# Patient Record
Sex: Female | Born: 1940 | Race: White | Hispanic: No | Marital: Married | Smoking: Former smoker
Health system: Southern US, Community
[De-identification: ages and names within clinical notes are randomized; demographics above are authoritative.]

## PROBLEM LIST (undated history)

## (undated) DIAGNOSIS — E785 Hyperlipidemia, unspecified: Secondary | ICD-10-CM

## (undated) DIAGNOSIS — I10 Essential (primary) hypertension: Secondary | ICD-10-CM

## (undated) DIAGNOSIS — K219 Gastro-esophageal reflux disease without esophagitis: Secondary | ICD-10-CM

## (undated) DIAGNOSIS — I2699 Other pulmonary embolism without acute cor pulmonale: Secondary | ICD-10-CM

## (undated) HISTORY — PX: CHOLECYSTECTOMY: SHX55

## (undated) HISTORY — PX: ABDOMINAL HYSTERECTOMY: SHX81

## (undated) HISTORY — PX: EYE SURGERY: SHX253

---

## 2005-07-14 ENCOUNTER — Ambulatory Visit: Payer: Self-pay | Admitting: Internal Medicine

## 2006-07-15 ENCOUNTER — Ambulatory Visit: Payer: Self-pay | Admitting: Internal Medicine

## 2006-09-01 ENCOUNTER — Ambulatory Visit: Payer: Self-pay | Admitting: Internal Medicine

## 2006-09-02 ENCOUNTER — Inpatient Hospital Stay: Payer: Self-pay | Admitting: Surgery

## 2006-09-08 ENCOUNTER — Inpatient Hospital Stay: Payer: Self-pay | Admitting: Surgery

## 2006-09-08 ENCOUNTER — Other Ambulatory Visit: Payer: Self-pay

## 2007-04-05 ENCOUNTER — Inpatient Hospital Stay: Payer: Self-pay | Admitting: Internal Medicine

## 2007-04-05 ENCOUNTER — Ambulatory Visit: Payer: Self-pay | Admitting: Internal Medicine

## 2007-07-19 ENCOUNTER — Ambulatory Visit: Payer: Self-pay | Admitting: Internal Medicine

## 2007-09-10 ENCOUNTER — Ambulatory Visit: Payer: Self-pay | Admitting: Ophthalmology

## 2007-09-16 ENCOUNTER — Ambulatory Visit: Payer: Self-pay | Admitting: Ophthalmology

## 2008-06-26 ENCOUNTER — Ambulatory Visit: Payer: Self-pay | Admitting: Unknown Physician Specialty

## 2008-07-19 ENCOUNTER — Ambulatory Visit: Payer: Self-pay | Admitting: Internal Medicine

## 2009-07-20 ENCOUNTER — Ambulatory Visit: Payer: Self-pay | Admitting: Internal Medicine

## 2009-11-19 ENCOUNTER — Ambulatory Visit: Payer: Self-pay | Admitting: Internal Medicine

## 2009-11-19 IMAGING — CT CT CHEST W/ CM
2 series · 15 of 31 positions shown, 19 images · non-contrast
Comparison: none

REASON FOR EXAM: SOB   Hx PE  Call Report [[email or web address]_NUMBER]
COMMENTS:

PROCEDURE:     CT  - CT CHEST (FOR PE) W  - [DATE]  [DATE]
RESULT:     Chest CT dated [DATE] comparison made to prior study dated
[DATE].
TECHNIQUE: Helical 3 mm sections were obtained from the thoracic inlet
through the lung bases status post intravenous administration of 100 mL
[QE].

[Series 4: soft tissue · axial · 0.63mm/px · z∈[+262,+304]mm · 2 of 95 slices shown]
[im 8/95  mediastinal]
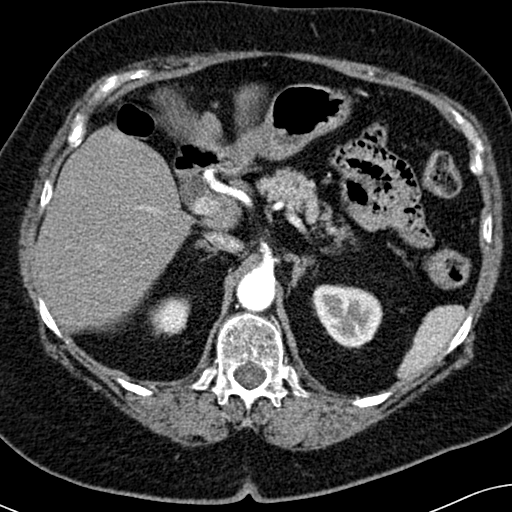
[im 22/95  mediastinal]
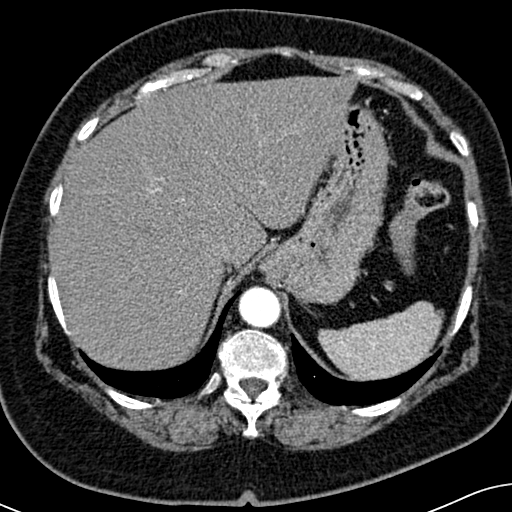

[Series 5: lung windows · axial · 0.63mm/px · z∈[+268,+498]mm · 13 of 93 slices shown, 17 images]
[im 8/93  mediastinal]
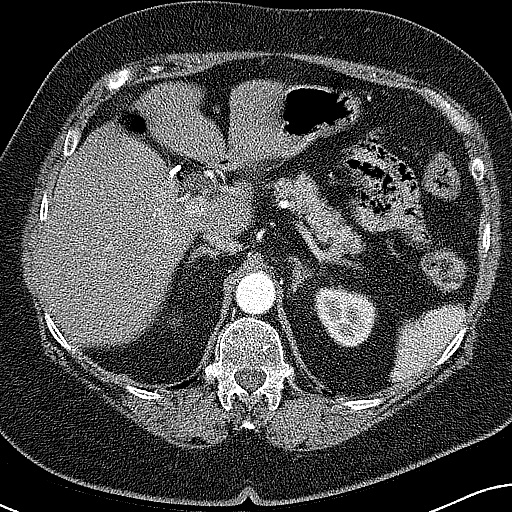
[im 8/93  lung]
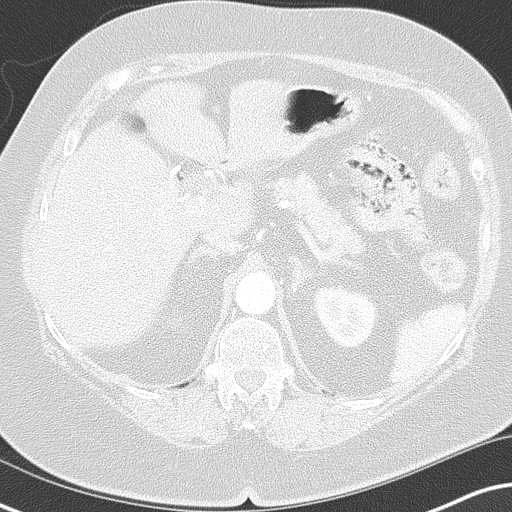
[im 15/93  lung]
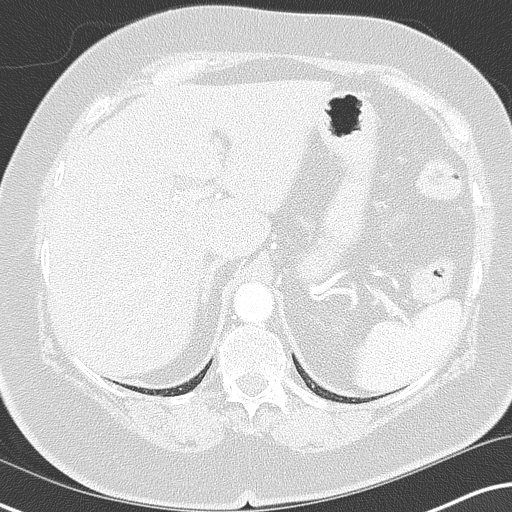
[im 22/93  lung]
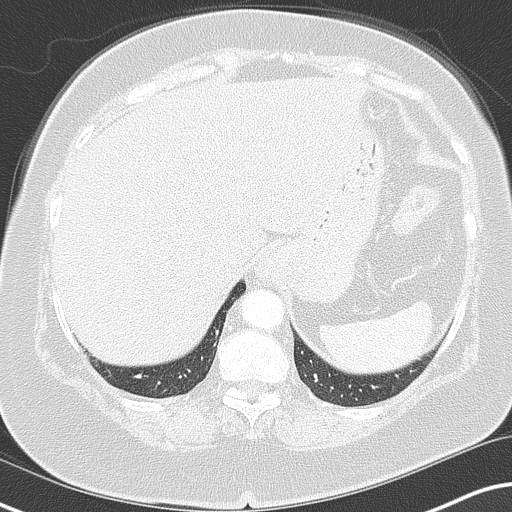
[im 29/93  lung]
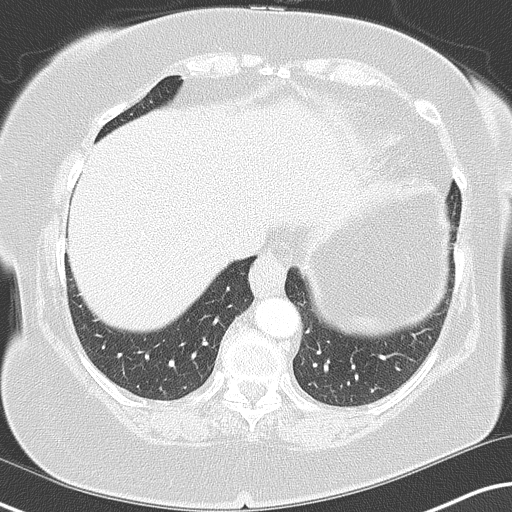
[im 36/93  mediastinal]
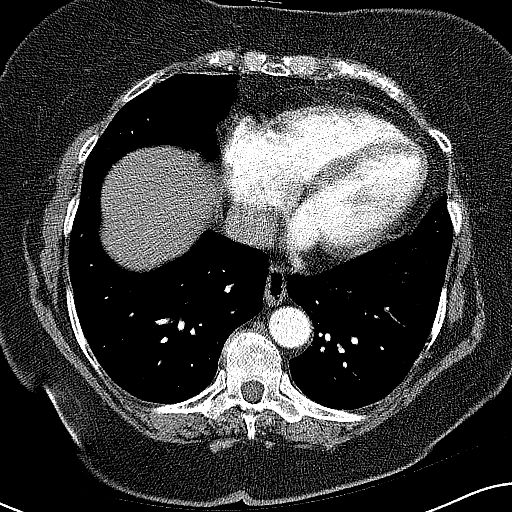
[im 36/93  lung]
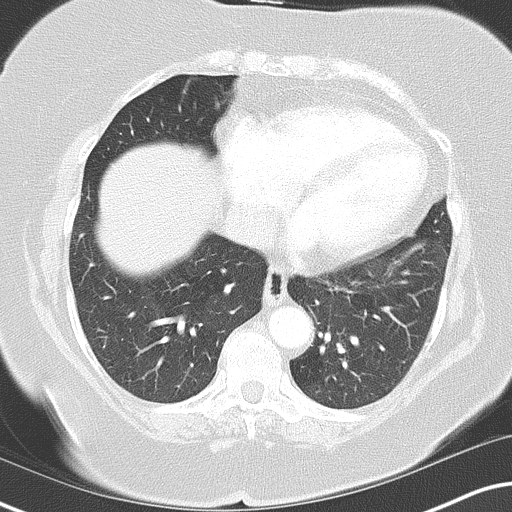
[im 43/93  lung]
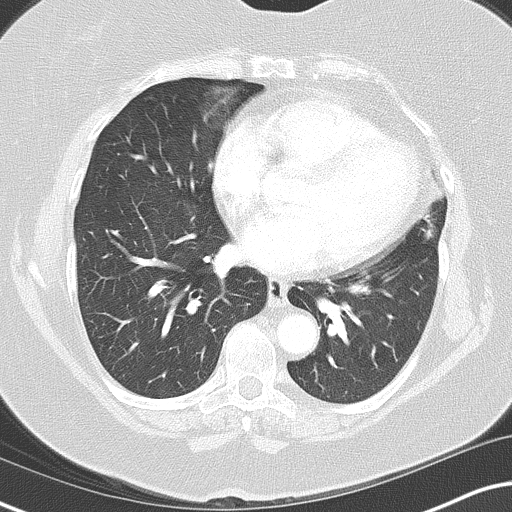
[im 47/93  lung]
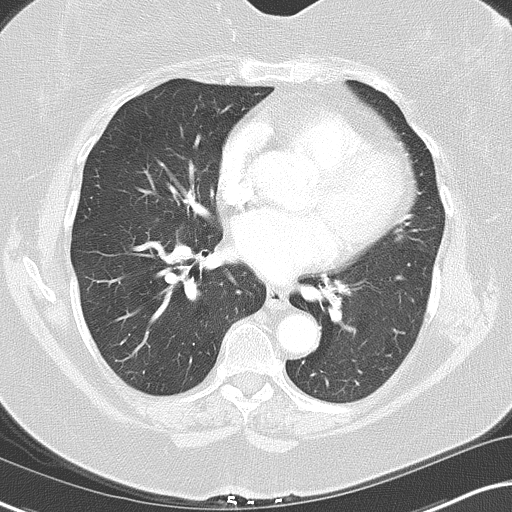
[im 50/93  lung]
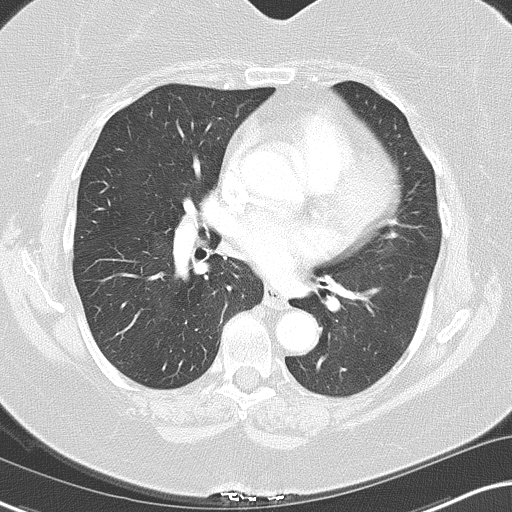
[im 57/93  mediastinal]
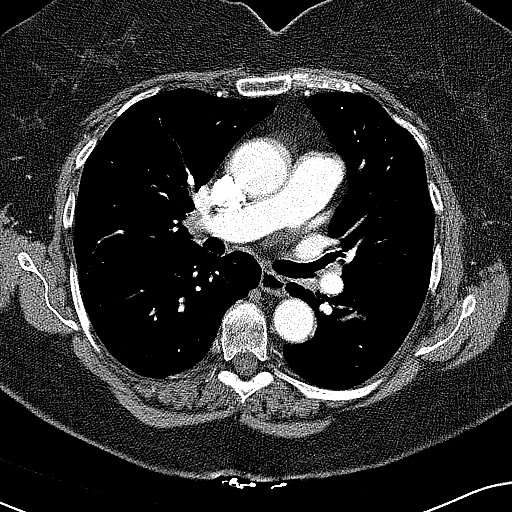
[im 57/93  lung]
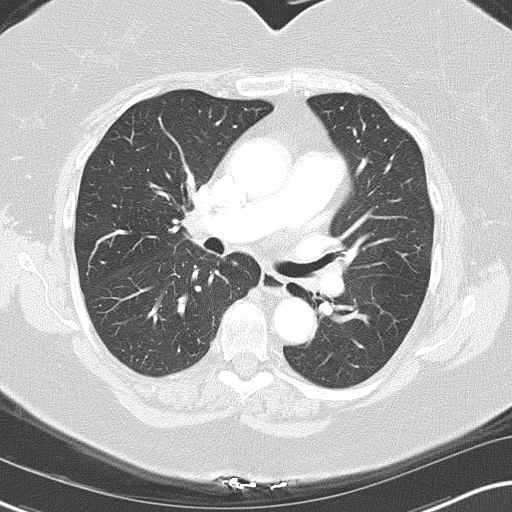
[im 64/93  lung]
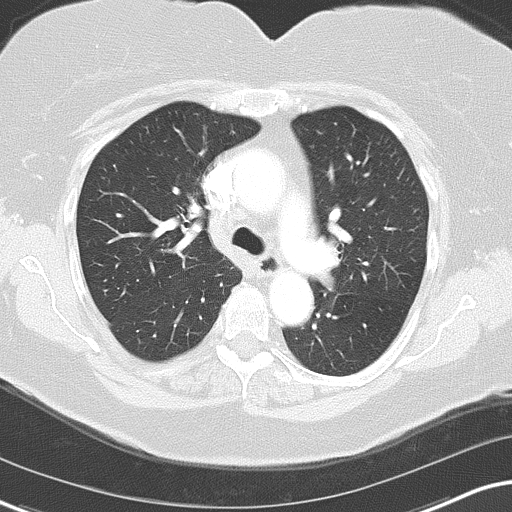
[im 71/93  lung]
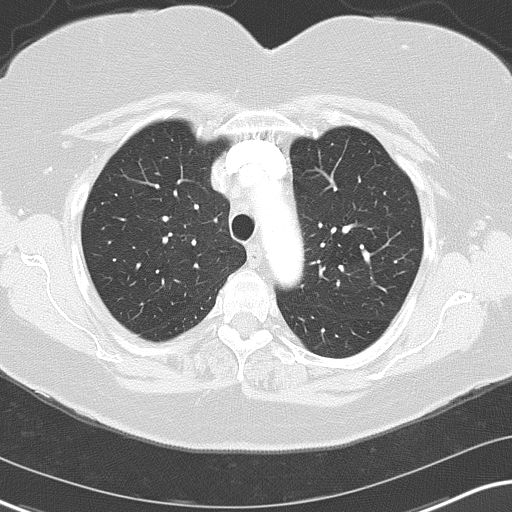
[im 78/93  lung]
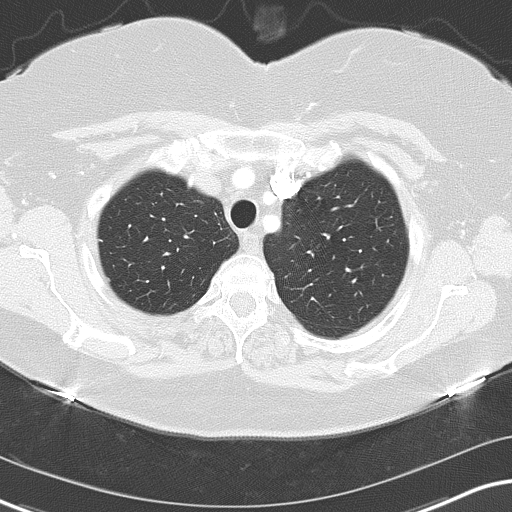
[im 85/93  mediastinal]
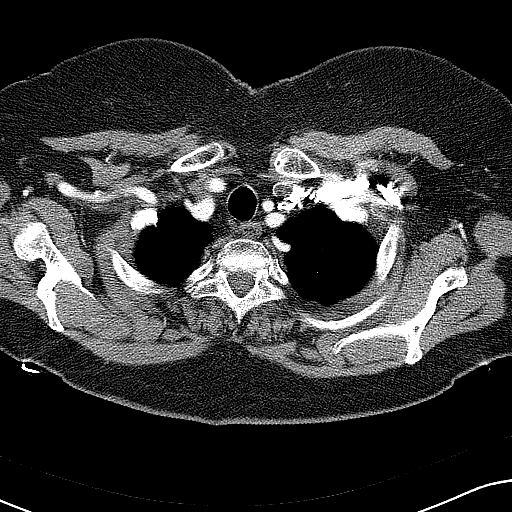
[im 85/93  lung]
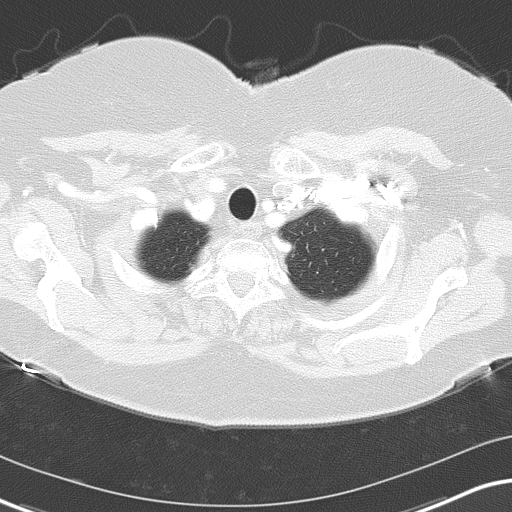

[15 of 31 positions shown; findings below may reference images not displayed]

FINDINGS: Evaluation of the mediastinum and hilar regions and structures demonstrates
no evidence of mediastinal nor hilar adenopathy nor masses. There is no
evidence of filling defects within the main lobar or segmental pulmonary
arteries. The previously described defects within the right and left
pulmonary arterial systems have resolved. The lung parenchyma demonstrate no
evidence of focal infiltrates effusions edema masses nor nodules. The
visualized upper abdominal viscera a straight no gross abnormalities.
IMPRESSION: No CT evidence of pulmonary arterial embolic disease nor
evidence of focal or acute intrathoracic abnormalities.

## 2010-09-04 ENCOUNTER — Ambulatory Visit: Payer: Self-pay | Admitting: Internal Medicine

## 2011-09-06 ENCOUNTER — Emergency Department: Payer: Self-pay | Admitting: Internal Medicine

## 2011-09-12 ENCOUNTER — Ambulatory Visit: Payer: Self-pay | Admitting: Internal Medicine

## 2011-09-13 ENCOUNTER — Inpatient Hospital Stay: Payer: Self-pay | Admitting: Internal Medicine

## 2011-11-03 ENCOUNTER — Inpatient Hospital Stay: Payer: Self-pay | Admitting: Internal Medicine

## 2011-11-18 ENCOUNTER — Ambulatory Visit: Payer: Self-pay | Admitting: Internal Medicine

## 2012-11-19 ENCOUNTER — Ambulatory Visit: Payer: Self-pay | Admitting: Internal Medicine

## 2013-07-18 ENCOUNTER — Ambulatory Visit: Payer: Self-pay | Admitting: Unknown Physician Specialty

## 2013-07-20 LAB — PATHOLOGY REPORT

## 2013-11-21 ENCOUNTER — Ambulatory Visit: Payer: Self-pay | Admitting: Internal Medicine

## 2014-11-22 ENCOUNTER — Ambulatory Visit: Payer: Self-pay | Admitting: Internal Medicine

## 2015-03-12 ENCOUNTER — Ambulatory Visit: Payer: Self-pay | Admitting: Internal Medicine

## 2015-10-23 ENCOUNTER — Other Ambulatory Visit: Payer: Self-pay | Admitting: Internal Medicine

## 2015-10-23 DIAGNOSIS — Z1231 Encounter for screening mammogram for malignant neoplasm of breast: Secondary | ICD-10-CM

## 2015-11-26 ENCOUNTER — Ambulatory Visit
Admission: RE | Admit: 2015-11-26 | Discharge: 2015-11-26 | Disposition: A | Discharge status: Home / Self Care | Payer: Medicare Other | Source: Ambulatory Visit | Attending: Internal Medicine | Admitting: Internal Medicine

## 2015-11-26 DIAGNOSIS — Z1231 Encounter for screening mammogram for malignant neoplasm of breast: Secondary | ICD-10-CM | POA: Diagnosis present

## 2016-07-08 ENCOUNTER — Encounter: Payer: Self-pay | Admitting: *Deleted

## 2016-07-09 ENCOUNTER — Ambulatory Visit
Admission: RE | Admit: 2016-07-09 | Discharge: 2016-07-09 | Disposition: A | Discharge status: Home / Self Care | Payer: Medicare Other | Source: Ambulatory Visit | Attending: Unknown Physician Specialty | Admitting: Unknown Physician Specialty

## 2016-07-09 ENCOUNTER — Encounter
Admission: RE | Disposition: A | Discharge status: Home / Self Care | Payer: Self-pay | Source: Ambulatory Visit | Attending: Unknown Physician Specialty

## 2016-07-09 ENCOUNTER — Encounter: Payer: Self-pay | Admitting: *Deleted

## 2016-07-09 ENCOUNTER — Ambulatory Visit: Payer: Medicare Other | Admitting: Anesthesiology

## 2016-07-09 DIAGNOSIS — Z7982 Long term (current) use of aspirin: Secondary | ICD-10-CM | POA: Diagnosis not present

## 2016-07-09 DIAGNOSIS — I10 Essential (primary) hypertension: Secondary | ICD-10-CM | POA: Insufficient documentation

## 2016-07-09 DIAGNOSIS — Z79899 Other long term (current) drug therapy: Secondary | ICD-10-CM | POA: Diagnosis not present

## 2016-07-09 DIAGNOSIS — Z803 Family history of malignant neoplasm of breast: Secondary | ICD-10-CM | POA: Diagnosis not present

## 2016-07-09 DIAGNOSIS — D123 Benign neoplasm of transverse colon: Secondary | ICD-10-CM | POA: Diagnosis not present

## 2016-07-09 DIAGNOSIS — K219 Gastro-esophageal reflux disease without esophagitis: Secondary | ICD-10-CM | POA: Diagnosis not present

## 2016-07-09 DIAGNOSIS — D122 Benign neoplasm of ascending colon: Secondary | ICD-10-CM | POA: Diagnosis not present

## 2016-07-09 DIAGNOSIS — Z87891 Personal history of nicotine dependence: Secondary | ICD-10-CM | POA: Diagnosis not present

## 2016-07-09 DIAGNOSIS — Z86711 Personal history of pulmonary embolism: Secondary | ICD-10-CM | POA: Diagnosis not present

## 2016-07-09 DIAGNOSIS — D125 Benign neoplasm of sigmoid colon: Secondary | ICD-10-CM | POA: Diagnosis not present

## 2016-07-09 DIAGNOSIS — Z1211 Encounter for screening for malignant neoplasm of colon: Secondary | ICD-10-CM | POA: Diagnosis present

## 2016-07-09 DIAGNOSIS — Z90710 Acquired absence of both cervix and uterus: Secondary | ICD-10-CM | POA: Insufficient documentation

## 2016-07-09 DIAGNOSIS — K621 Rectal polyp: Secondary | ICD-10-CM | POA: Insufficient documentation

## 2016-07-09 DIAGNOSIS — Z86010 Personal history of colonic polyps: Secondary | ICD-10-CM | POA: Insufficient documentation

## 2016-07-09 DIAGNOSIS — Z9049 Acquired absence of other specified parts of digestive tract: Secondary | ICD-10-CM | POA: Diagnosis not present

## 2016-07-09 DIAGNOSIS — K640 First degree hemorrhoids: Secondary | ICD-10-CM | POA: Insufficient documentation

## 2016-07-09 DIAGNOSIS — D124 Benign neoplasm of descending colon: Secondary | ICD-10-CM | POA: Diagnosis not present

## 2016-07-09 HISTORY — PX: COLONOSCOPY WITH PROPOFOL: SHX5780

## 2016-07-09 HISTORY — DX: Other pulmonary embolism without acute cor pulmonale: I26.99

## 2016-07-09 HISTORY — DX: Hyperlipidemia, unspecified: E78.5

## 2016-07-09 HISTORY — DX: Essential (primary) hypertension: I10

## 2016-07-09 HISTORY — DX: Gastro-esophageal reflux disease without esophagitis: K21.9

## 2016-07-09 SURGERY — COLONOSCOPY WITH PROPOFOL
Anesthesia: General

## 2016-07-09 MED ORDER — SODIUM CHLORIDE 0.9 % IV SOLN
INTRAVENOUS | Status: DC
  Administered 2016-07-09: 1000 mL via INTRAVENOUS

## 2016-07-09 MED ORDER — MIDAZOLAM HCL 5 MG/5ML IJ SOLN
INTRAMUSCULAR | Status: DC | PRN
  Administered 2016-07-09: 1 mg via INTRAVENOUS

## 2016-07-09 MED ORDER — PROPOFOL 10 MG/ML IV BOLUS
INTRAVENOUS | Status: DC | PRN
  Administered 2016-07-09: 15 mg via INTRAVENOUS
  Administered 2016-07-09: 10 mg via INTRAVENOUS
  Administered 2016-07-09: 25 mg via INTRAVENOUS

## 2016-07-09 MED ORDER — PROPOFOL 500 MG/50ML IV EMUL
INTRAVENOUS | Status: DC | PRN
  Administered 2016-07-09: 140 ug/kg/min via INTRAVENOUS

## 2016-07-09 MED ORDER — SODIUM CHLORIDE 0.9 % IV SOLN: INTRAVENOUS | Status: DC

## 2016-07-09 MED ORDER — FENTANYL CITRATE (PF) 100 MCG/2ML IJ SOLN
INTRAMUSCULAR | Status: DC | PRN
  Administered 2016-07-09: 50 ug via INTRAVENOUS

## 2016-07-09 MED ORDER — EPHEDRINE SULFATE 50 MG/ML IJ SOLN
INTRAMUSCULAR | Status: DC | PRN
  Administered 2016-07-09: 10 mg via INTRAVENOUS

## 2016-07-09 NOTE — H&P (Signed)
Primary Care Physician:  Rusty Aus, MD Primary Gastroenterologist:  Dr. Vira Agar  Pre-Procedure History & Physical: HPI:  Maria Manning is a 75 y.o. female is here for an colonoscopy.   Past Medical History  Diagnosis Date  . PE (pulmonary embolism)   . Hypertension   . GERD (gastroesophageal reflux disease)   . Elevated lipids     Past Surgical History  Procedure Laterality Date  . Abdominal hysterectomy      1993 1970's  . Cholecystectomy    . Eye Manning      cataract od    Prior to Admission medications   Medication Sig Start Date End Date Taking? Authorizing Provider  ALPRAZolam Duanne Moron) 0.25 MG tablet Take 0.25 mg by mouth at bedtime as needed for anxiety.   Yes Historical Provider, MD  amLODipine (NORVASC) 5 MG tablet Take 5 mg by mouth daily.   Yes Historical Provider, MD  aspirin 81 MG tablet Take 81 mg by mouth daily.   Yes Historical Provider, MD  calcium carbonate (OSCAL) 1500 (600 Ca) MG TABS tablet Take by mouth 2 (two) times daily with a meal.   Yes Historical Provider, MD  hydrochlorothiazide (HYDRODIURIL) 25 MG tablet Take 25 mg by mouth daily.   Yes Historical Provider, MD  losartan (COZAAR) 100 MG tablet Take 100 mg by mouth daily.   Yes Historical Provider, MD  metoprolol tartrate (LOPRESSOR) 25 MG tablet Take 25 mg by mouth 2 (two) times daily.   Yes Historical Provider, MD  Multiple Vitamin (MULTIVITAMIN) tablet Take 1 tablet by mouth daily.   Yes Historical Provider, MD  ondansetron (ZOFRAN-ODT) 8 MG disintegrating tablet Take 8 mg by mouth every 8 (eight) hours as needed for nausea or vomiting.   Yes Historical Provider, MD  pantoprazole (PROTONIX) 40 MG tablet Take 40 mg by mouth daily.   Yes Historical Provider, MD    Allergies as of 06/13/2016  . (Not on File)    Family History  Problem Relation Age of Onset  . Breast cancer Maternal Aunt 65  . Breast cancer Mother 8    Social History   Social History  . Marital Status: Married     Spouse Name: N/A  . Number of Children: N/A  . Years of Education: N/A   Occupational History  . Not on file.   Social History Main Topics  . Smoking status: Former Research scientist (life sciences)  . Smokeless tobacco: Never Used  . Alcohol Use: No  . Drug Use: No  . Sexual Activity: Not on file   Other Topics Concern  . Not on file   Social History Narrative    Review of Systems: See HPI, otherwise negative ROS  Physical Exam: BP 202/92 mmHg  Pulse 83  Temp(Src) 97.9 F (36.6 C) (Tympanic)  Resp 20  Ht 5\' 5"  (1.651 m)  Wt 90.719 kg (200 lb)  BMI 33.28 kg/m2  SpO2 98% General:   Alert,  pleasant and cooperative in NAD Head:  Normocephalic and atraumatic. Neck:  Supple; no masses or thyromegaly. Lungs:  Clear throughout to auscultation.    Heart:  Regular rate and rhythm. Abdomen:  Soft, nontender and nondistended. Normal bowel sounds, without guarding, and without rebound.   Neurologic:  Alert and  oriented x4;  grossly normal neurologically.  Impression/Plan: Maria Manning is here for an colonoscopy to be performed for Maria Manning colon polyps  Risks, benefits, limitations, and alternatives regarding  colonoscopy have been reviewed with the patient.  Questions have  been answered.  All parties agreeable.   Gaylyn Cheers, MD  07/09/2016, 9:58 AM

## 2016-07-09 NOTE — Anesthesia Postprocedure Evaluation (Signed)
Anesthesia Post Note  Patient: Maria Manning  Procedure(s) Performed: Procedure(s) (LRB): COLONOSCOPY WITH PROPOFOL (N/A)  Patient location during evaluation: Endoscopy Anesthesia Type: General Level of consciousness: awake and alert Pain management: pain level controlled Vital Signs Assessment: post-procedure vital signs reviewed and stable Respiratory status: spontaneous breathing, nonlabored ventilation, respiratory function stable and patient connected to nasal cannula oxygen Cardiovascular status: blood pressure returned to baseline and stable Postop Assessment: no signs of nausea or vomiting Anesthetic complications: no    Last Vitals:  Filed Vitals:   07/09/16 1103 07/09/16 1113  BP: 123/58 120/54  Pulse: 60 62  Temp:    Resp: 12 18    Last Pain: There were no vitals filed for this visit.               Martha Clan

## 2016-07-09 NOTE — Anesthesia Preprocedure Evaluation (Signed)
Anesthesia Evaluation  Patient identified by MRN, date of birth, ID band Patient awake    Reviewed: Allergy & Precautions, H&P , NPO status , Patient's Chart, lab work & pertinent test results, reviewed documented beta blocker date and time   History of Anesthesia Complications Negative for: history of anesthetic complications  Airway Mallampati: I  TM Distance: >3 FB Neck ROM: full    Dental no notable dental hx. (+) Partial Upper, Partial Lower   Pulmonary neg pulmonary ROS, former smoker,    Pulmonary exam normal breath sounds clear to auscultation       Cardiovascular Exercise Tolerance: Good hypertension, On Medications and On Home Beta Blockers (-) angina(-) CAD, (-) Past MI, (-) Cardiac Stents and (-) CABG Normal cardiovascular exam(-) dysrhythmias (-) Valvular Problems/Murmurs Rhythm:regular Rate:Normal     Neuro/Psych negative neurological ROS  negative psych ROS   GI/Hepatic Neg liver ROS, GERD  ,  Endo/Other  negative endocrine ROS  Renal/GU negative Renal ROS  negative genitourinary   Musculoskeletal   Abdominal   Peds  Hematology negative hematology ROS (+)   Anesthesia Other Findings Past Medical History:   PE (pulmonary embolism)                                      Hypertension                                                 GERD (gastroesophageal reflux disease)                       Elevated lipids                                              Reproductive/Obstetrics negative OB ROS                             Anesthesia Physical Anesthesia Plan  ASA: II  Anesthesia Plan: General   Post-op Pain Management:    Induction:   Airway Management Planned:   Additional Equipment:   Intra-op Plan:   Post-operative Plan:   Informed Consent: I have reviewed the patients History and Physical, chart, labs and discussed the procedure including the risks, benefits and  alternatives for the proposed anesthesia with the patient or authorized representative who has indicated his/her understanding and acceptance.   Dental Advisory Given  Plan Discussed with: Anesthesiologist, CRNA and Surgeon  Anesthesia Plan Comments:         Anesthesia Quick Evaluation

## 2016-07-09 NOTE — Anesthesia Procedure Notes (Signed)
Date/Time: 07/09/2016 10:09 AM Performed by: Kennon Holter Pre-anesthesia Checklist: Patient being monitored, Suction available, Patient identified and Timeout performed Patient Re-evaluated:Patient Re-evaluated prior to inductionOxygen Delivery Method: Nasal cannula Preoxygenation: Pre-oxygenation with 100% oxygen Intubation Type: IV induction Placement Confirmation: positive ETCO2

## 2016-07-09 NOTE — Transfer of Care (Signed)
Immediate Anesthesia Transfer of Care Note  Patient: Maria Manning  Procedure(s) Performed: Procedure(s): COLONOSCOPY WITH PROPOFOL (N/A)  Patient Location: PACU and Endoscopy Unit  Anesthesia Type:General  Level of Consciousness: awake and alert   Airway & Oxygen Therapy: Patient Spontanous Breathing and Patient connected to nasal cannula oxygen  Post-op Assessment: Report given to RN and Post -op Vital signs reviewed and stable  Post vital signs: Reviewed and stable  Last Vitals:  Filed Vitals:   07/09/16 1043 07/09/16 1046  BP: 93/43   Pulse: 72   Temp: 36.3 C 36.3 C  Resp:      Last Pain: There were no vitals filed for this visit.       Complications: No apparent anesthesia complications

## 2016-07-09 NOTE — Op Note (Signed)
[facility] Gastroenterology Patient Name: Maria Manning Procedure Date: 07/09/2016 10:06 AM MRN: [id number] Account #: [id number] Date of Birth: [birth year] Admit Type: Outpatient Age: 75 Room: [facility] ENDO ROOM 4 Gender: Female Note Status: Finalized Procedure:            Colonoscopy Indications:          High risk colon cancer surveillance: Personal history                        of colonic polyps Providers:            Manya Silvas, MD Referring MD:         Rusty Aus, MD (Referring MD) Medicines:            Propofol per Anesthesia Complications:        No immediate complications. Procedure:            Pre-Anesthesia Assessment:                       - After reviewing the risks and benefits, the patient                        was deemed in satisfactory condition to undergo the                        procedure.                       After obtaining informed consent, the colonoscope was                        passed under direct vision. Throughout the procedure,                        the patient's blood pressure, pulse, and oxygen                        saturations were monitored continuously. The                        Colonoscope was introduced through the anus and                        advanced to the the cecum, identified by appendiceal                        orifice and ileocecal valve. The colonoscopy was                        performed without difficulty. The patient tolerated the                        procedure well. The quality of the bowel preparation                        was excellent. Findings:      Three sessile polyps were found in the transverse colon and ascending       colon. The polyps were diminutive in size. These polyps were removed       with a jumbo cold forceps. Resection and retrieval were complete.  Four sessile polyps were found in the descending colon. The polyps were       diminutive in size. These polyps were  removed with a jumbo cold forceps.       Resection and retrieval were complete.      Two sessile polyps were found in the rectum and sigmoid colon. The       polyps were diminutive in size. These polyps were removed with a jumbo       cold forceps. Resection and retrieval were complete.      Internal hemorrhoids were found during endoscopy. The hemorrhoids were       small and Grade I (internal hemorrhoids that do not prolapse).      The exam was otherwise without abnormality. Impression:           - Three diminutive polyps in the transverse colon and                        in the ascending colon, removed with a jumbo cold                        forceps. Resected and retrieved.                       - Four diminutive polyps in the descending colon,                        removed with a jumbo cold forceps. Resected and                        retrieved.                       - Two diminutive polyps in the rectum and in the                        sigmoid colon, removed with a jumbo cold forceps.                        Resected and retrieved.                       - Internal hemorrhoids.                       - The examination was otherwise normal. Recommendation:       - Await pathology results. Manya Silvas, MD 07/09/2016 10:40:34 AM This report has been signed electronically. Number of Addenda: 0 Note Initiated On: 07/09/2016 10:06 AM Scope Withdrawal Time: 0 hours 14 minutes 44 seconds  Total Procedure Duration: 0 hours 24 minutes 32 seconds       [facility]

## 2016-07-10 LAB — SURGICAL PATHOLOGY

## 2016-07-13 ENCOUNTER — Encounter: Payer: Self-pay | Admitting: Unknown Physician Specialty

## 2016-10-23 ENCOUNTER — Encounter: Payer: Self-pay | Admitting: Emergency Medicine

## 2016-10-23 ENCOUNTER — Emergency Department: Payer: Medicare Other

## 2016-10-23 ENCOUNTER — Emergency Department
Admission: EM | Admit: 2016-10-23 | Discharge: 2016-10-23 | Disposition: A | Discharge status: Home / Self Care | Payer: Medicare Other | Attending: Emergency Medicine | Admitting: Emergency Medicine

## 2016-10-23 DIAGNOSIS — I10 Essential (primary) hypertension: Secondary | ICD-10-CM | POA: Insufficient documentation

## 2016-10-23 DIAGNOSIS — M25511 Pain in right shoulder: Secondary | ICD-10-CM | POA: Insufficient documentation

## 2016-10-23 DIAGNOSIS — R1011 Right upper quadrant pain: Secondary | ICD-10-CM | POA: Insufficient documentation

## 2016-10-23 DIAGNOSIS — Z87891 Personal history of nicotine dependence: Secondary | ICD-10-CM | POA: Insufficient documentation

## 2016-10-23 DIAGNOSIS — Z7982 Long term (current) use of aspirin: Secondary | ICD-10-CM | POA: Diagnosis not present

## 2016-10-23 DIAGNOSIS — Z79899 Other long term (current) drug therapy: Secondary | ICD-10-CM | POA: Insufficient documentation

## 2016-10-23 DIAGNOSIS — R109 Unspecified abdominal pain: Secondary | ICD-10-CM

## 2016-10-23 LAB — URINALYSIS COMPLETE WITH MICROSCOPIC (ARMC ONLY)
BILIRUBIN URINE: NEGATIVE
Glucose, UA: NEGATIVE mg/dL
Hgb urine dipstick: NEGATIVE
Ketones, ur: NEGATIVE mg/dL
Nitrite: NEGATIVE
PH: 5 (ref 5.0–8.0)
PROTEIN: NEGATIVE mg/dL
Specific Gravity, Urine: 1.017 (ref 1.005–1.030)

## 2016-10-23 LAB — CBC
HCT: 40.5 % (ref 35.0–47.0)
HEMOGLOBIN: 13.6 g/dL (ref 12.0–16.0)
MCH: 30.3 pg (ref 26.0–34.0)
MCHC: 33.5 g/dL (ref 32.0–36.0)
MCV: 90.5 fL (ref 80.0–100.0)
PLATELETS: 268 10*3/uL (ref 150–440)
RBC: 4.47 MIL/uL (ref 3.80–5.20)
RDW: 14.3 % (ref 11.5–14.5)
WBC: 13.9 10*3/uL — ABNORMAL HIGH (ref 3.6–11.0)

## 2016-10-23 LAB — BASIC METABOLIC PANEL
Anion gap: 11 (ref 5–15)
BUN: 22 mg/dL — ABNORMAL HIGH (ref 6–20)
CALCIUM: 9.5 mg/dL (ref 8.9–10.3)
CO2: 25 mmol/L (ref 22–32)
CREATININE: 0.92 mg/dL (ref 0.44–1.00)
Chloride: 99 mmol/L — ABNORMAL LOW (ref 101–111)
GFR calc Af Amer: >60 mL/min (ref >60)
GFR calc non Af Amer: 60 mL/min — ABNORMAL LOW (ref >60)
GLUCOSE: 182 mg/dL — AB (ref 65–99)
Potassium: 3.9 mmol/L (ref 3.5–5.1)
Sodium: 135 mmol/L (ref 135–145)

## 2016-10-23 LAB — TROPONIN I

## 2016-10-23 NOTE — Discharge Instructions (Signed)
Please return immediately if condition worsens. Please contact her primary physician or the physician you were given for referral. If you have any specialist physicians involved in her treatment and plan please also contact them. Thank you for using [facility] emergency [facility].  Please use over-the-counter Tylenol for pain.

## 2016-10-23 NOTE — ED Notes (Signed)
Pt discharged to home.  Family member driving.  Discharge instructions reviewed.  Verbalized understanding.  No questions or concerns at this time.  Teach back verified.  Pt in NAD.  No items left in ED.   

## 2016-10-23 NOTE — ED Provider Notes (Signed)
Time Seen: Approximately 1949  I have reviewed the triage notes  Chief Complaint: Shoulder Pain; Abdominal Pain; Emesis; and Chest Pain   History of Present Illness: Maria Manning is a 75 y.o. female who describes a very sharp and transient right shoulder pain and right-sided rib cage and occasional pain toward her right flank area. Patient's been going on now for several months though today was the first day that she had any nausea or vomiting with it. She points mainly to the right flank area as the source of her discomfort and hasn't had any history of trauma, rash, fever, productive cough etc. She is not aware of any obvious exacerbating or relieving factors. Episode today started approximately an hour prior to arrival and since waiting here in emergency department is totally resolved at this point.  Past Medical History:  Diagnosis Date  . Elevated lipids   . GERD (gastroesophageal reflux disease)   . Hypertension   . PE (pulmonary embolism)     There are no active problems to display for this patient.   Past Surgical History:  Procedure Laterality Date  . ABDOMINAL HYSTERECTOMY     1993 1970's  . CHOLECYSTECTOMY    . COLONOSCOPY WITH PROPOFOL N/A 07/09/2016   Procedure: COLONOSCOPY WITH PROPOFOL;  Surgeon: Manya Silvas, MD;  Location: [facility] ENDOSCOPY;  Service: Endoscopy;  Laterality: N/A;  . EYE SURGERY     cataract od    Past Surgical History:  Procedure Laterality Date  . ABDOMINAL HYSTERECTOMY     1993 1970's  . CHOLECYSTECTOMY    . COLONOSCOPY WITH PROPOFOL N/A 07/09/2016   Procedure: COLONOSCOPY WITH PROPOFOL;  Surgeon: Manya Silvas, MD;  Location: [facility] ENDOSCOPY;  Service: Endoscopy;  Laterality: N/A;  . EYE SURGERY     cataract od    Current Outpatient Rx  . Order #: [id number] Class: Historical Med  . Order #: [id number] Class: Historical Med  . Order #: [id number] Class: Historical Med  . Order #: [id number] Class: Historical Med  . Order #:  [id number] Class: Historical Med  . Order #: [id number] Class: Historical Med  . Order #: [id number] Class: Historical Med  . Order #: [id number] Class: Historical Med  . Order #: [id number] Class: Historical Med  . Order #: [id number] Class: Historical Med    Allergies:  Ace inhibitors; Codeine; Penicillins; Prevacid [lansoprazole]; Ranitidine; and Sulfur  Family History: Family History  Problem Relation Age of Onset  . Breast cancer Maternal Aunt 65  . Breast cancer Mother 69    Social History: Social History  Substance Use Topics  . Smoking status: Former Research scientist (life sciences)  . Smokeless tobacco: Never Used  . Alcohol use No     Review of Systems:   10 point review of systems was performed and was otherwise negative:  Constitutional: No fever Eyes: No visual disturbances ENT: No sore throat, ear pain Cardiac: No current or left-sided chest pain Respiratory: No shortness of breath, wheezing, or stridor Abdomen: No current abdominal pain but she points mainly to the right upper quadrant and right flank area Endocrine: No weight loss, No night sweats Extremities: No peripheral edema, cyanosis Skin: No rashes, easy bruising Neurologic: No focal weakness, trouble with speech or swollowing Urologic: No dysuria, Hematuria, or urinary frequency   Physical Exam:  ED Triage Vitals  Enc Vitals Group     BP 10/23/16 1709 131/63     Pulse Rate 10/23/16 1709 (!) 105     Resp 10/23/16 1709 20     Temp 10/23/16 1709 97.6 F (  36.4 C)     Temp Source 10/23/16 1709 Oral     SpO2 10/23/16 1709 99 %     Weight 10/23/16 1710 219 lb (99.3 kg)     Height 10/23/16 1710 5\' 5"  (1.651 m)     Head Circumference --      Peak Flow --      Pain Score 10/23/16 1720 0     Pain Loc --      Pain Edu? --      Excl. in [place]? --     General: Awake , Alert , and Oriented times 3; GCS 15 Head: Normal cephalic , atraumatic Eyes: Pupils equal , round, reactive to light Nose/Throat: No nasal drainage, patent upper  airway without erythema or exudate.  Neck: Supple, Full range of motion, No anterior adenopathy or palpable thyroid masses Lungs: Clear to ascultation without wheezes , rhonchi, or rales Heart: Regular rate, regular rhythm without murmurs , gallops , or rubs Abdomen: Soft, non tender without rebound, guarding , or rigidity; bowel sounds positive and symmetric in all 4 quadrants. No organomegaly .        Extremities: 2 plus symmetric pulses. No edema, clubbing or cyanosis Neurologic: normal ambulation, Motor symmetric without deficits, sensory intact Skin: warm, dry, no rashes   Labs:   All laboratory work was reviewed including any pertinent negatives or positives listed below:  Labs Reviewed  BASIC METABOLIC PANEL - Abnormal; Notable for the following:       Result Value   Chloride 99 (*)    Glucose, Bld 182 (*)    BUN 22 (*)    GFR calc non Af Amer 60 (*)    All other components within normal limits  CBC - Abnormal; Notable for the following:    WBC 13.9 (*)    All other components within normal limits  URINALYSIS COMPLETEWITH MICROSCOPIC ([facility] ONLY) - Abnormal; Notable for the following:    Color, Urine YELLOW (*)    APPearance HAZY (*)    Leukocytes, UA TRACE (*)    Bacteria, UA MANY (*)    Squamous Epithelial / LPF 0-5 (*)    All other components within normal limits  TROPONIN I   Laboratory work was reviewed and showed no clinically significant abnormalities.  EKG:  ED ECG REPORT I, Daymon Larsen, the attending physician, personally viewed and interpreted this ECG.  Date: 10/24/2016 EKG Time: *1706 Rate: 106 Rhythm: Sinus tachycardia QRS Axis: normal Intervals: normal ST/T Wave abnormalities: normal Conduction Disturbances: none Narrative Interpretation: unremarkable Low-voltage QRS, nonspecific ST-T wave changes no acute ischemic changes noted   Radiology:  "Dg Chest 2 View  Result Date: 10/23/2016 CLINICAL DATA:  Right side chest pain for 2-3 months  EXAM: CHEST  2 VIEW COMPARISON:  November 03, 2011 FINDINGS: The mediastinal contour is normal. The heart size is enlarged. There is no focal infiltrate, pulmonary edema, or pleural effusion. The visualized skeletal structures are unremarkable. IMPRESSION: No active cardiopulmonary disease.  Cardiomegaly. Electronically Signed   By: Abelardo Diesel M.D.   On: 10/23/2016 17:59   Ct Renal Stone Study  Result Date: 10/23/2016 CLINICAL DATA:  Right upper quadrant abdomen pain with vomiting starting today. EXAM: CT ABDOMEN AND PELVIS WITHOUT CONTRAST TECHNIQUE: Multidetector CT imaging of the abdomen and pelvis was performed following the standard protocol without IV contrast. COMPARISON:  November 04, 2011 FINDINGS: Lower chest: No acute abnormality. Hepatobiliary: Patient is status post prior cholecystectomy. There is postsurgical dilatation of common bile  duct measuring 1.6 cm. There is postsurgical pneumobilia unchanged compared to prior exam. The liver is unremarkable. Pancreas: Unremarkable. No pancreatic ductal dilatation or surrounding inflammatory changes. Spleen: Normal in size without focal abnormality. Adrenals/Urinary Tract: Adrenal glands are unremarkable. Kidneys are normal, without renal calculi, focal lesion, or hydronephrosis. Bladder is unremarkable. Stomach/Bowel: Stomach is within normal limits. There is a small to moderate hiatal hernia. The appendix is not seen but no inflammation is noted around cecum. No evidence of bowel wall thickening, distention, or inflammatory changes. Vascular/Lymphatic: Aortic atherosclerosis. No enlarged abdominal or pelvic lymph nodes. Reproductive: Status post hysterectomy. No adnexal masses. Other: There is minimal umbilical herniation of mesenteric fat. There is no ascites. Musculoskeletal: Degenerative joint changes of the spine are identified. IMPRESSION: No acute abnormality identified in the abdomen and pelvis. Prior cholecystectomy and hysterectomy.  Electronically Signed   By: Abelardo Diesel M.D.   On: 10/23/2016 20:36  "  I personally reviewed the radiologic studies    ED Course:  Patient's stay here was uneventful was not sure the exact source of her discomfort. She's had a previous cholecystectomy and did not appear to be any signs of liver pathology, lung pathology etc. Pain is very sharp and transient nature. I felt this was unlikely to be a pulmonary embolism or acute coronary syndrome at this time. Patient's had a history of PE and states that this pain is vastly different from what she's had before in the past. He is not hypoxic and the pain seems to be very sharp and transient nature. It is nonpleuritic. Chest x-ray shows no signs of pneumonia etc. There is no rash to indicate herpes zoster at this time. It does not seem to be reproducible with movement or palpation. It does not seem to be vascular in nature given the location of the discomfort. The patient was advised her workup is not complete though we can't say it's not a kidney stone or urinary tract infection, etc. She was advised to contact her primary physician and take Tylenol for pain as needed. Clinical Course     Assessment: * Acute transient right-sided flank pain   Final Clinical Impression:  Final diagnoses:  Right flank pain     Plan:  Outpatient Patient was advised to return immediately if condition worsens. Patient was advised to follow up with their primary care physician or other specialized physicians involved in their outpatient care. The patient and/or family member/power of attorney had laboratory results reviewed at the bedside. All questions and concerns were addressed and appropriate discharge instructions were distributed by the nursing staff.             Daymon Larsen, MD 10/24/16 [phone]

## 2016-10-23 NOTE — ED Triage Notes (Signed)
Pt to ED c/o right shoulder pain, right rib cage pain, and RUQ pain that started today with 1 episode of vomiting.  Pt states hx of similar pain, hx of acid reflux.  Pt denies fever, cough, or SOB.  Pt presents A&Ox4, speaking in complete and coherent sentences, chest rise even and unlabored.

## 2016-10-24 ENCOUNTER — Other Ambulatory Visit: Payer: Self-pay | Admitting: Internal Medicine

## 2016-10-24 DIAGNOSIS — Z1231 Encounter for screening mammogram for malignant neoplasm of breast: Secondary | ICD-10-CM

## 2016-12-01 ENCOUNTER — Ambulatory Visit
Admission: RE | Admit: 2016-12-01 | Discharge: 2016-12-01 | Disposition: A | Discharge status: Home / Self Care | Payer: Medicare Other | Source: Ambulatory Visit | Attending: Internal Medicine | Admitting: Internal Medicine

## 2016-12-01 DIAGNOSIS — Z1231 Encounter for screening mammogram for malignant neoplasm of breast: Secondary | ICD-10-CM

## 2017-04-21 ENCOUNTER — Ambulatory Visit: Payer: Medicare Other

## 2017-04-21 ENCOUNTER — Inpatient Hospital Stay
Admission: EM | Admit: 2017-04-21 | Discharge: 2017-04-23 | DRG: 446 | Disposition: A | Discharge status: Home / Self Care | Payer: Medicare Other | Attending: Internal Medicine | Admitting: Internal Medicine

## 2017-04-21 ENCOUNTER — Ambulatory Visit
Admission: RE | Admit: 2017-04-21 | Discharge: 2017-04-21 | Disposition: A | Discharge status: Home / Self Care | Payer: Medicare Other | Source: Ambulatory Visit | Attending: Internal Medicine | Admitting: Internal Medicine

## 2017-04-21 ENCOUNTER — Encounter: Payer: Self-pay | Admitting: Medical Oncology

## 2017-04-21 ENCOUNTER — Other Ambulatory Visit: Payer: Self-pay | Admitting: Internal Medicine

## 2017-04-21 DIAGNOSIS — R1011 Right upper quadrant pain: Secondary | ICD-10-CM

## 2017-04-21 DIAGNOSIS — I10 Essential (primary) hypertension: Secondary | ICD-10-CM | POA: Diagnosis present

## 2017-04-21 DIAGNOSIS — Z885 Allergy status to narcotic agent status: Secondary | ICD-10-CM | POA: Diagnosis not present

## 2017-04-21 DIAGNOSIS — Z86711 Personal history of pulmonary embolism: Secondary | ICD-10-CM

## 2017-04-21 DIAGNOSIS — Z888 Allergy status to other drugs, medicaments and biological substances status: Secondary | ICD-10-CM

## 2017-04-21 DIAGNOSIS — E669 Obesity, unspecified: Secondary | ICD-10-CM | POA: Diagnosis present

## 2017-04-21 DIAGNOSIS — Z79899 Other long term (current) drug therapy: Secondary | ICD-10-CM | POA: Diagnosis not present

## 2017-04-21 DIAGNOSIS — B179 Acute viral hepatitis, unspecified: Secondary | ICD-10-CM

## 2017-04-21 DIAGNOSIS — Z9049 Acquired absence of other specified parts of digestive tract: Secondary | ICD-10-CM

## 2017-04-21 DIAGNOSIS — K8020 Calculus of gallbladder without cholecystitis without obstruction: Secondary | ICD-10-CM | POA: Diagnosis present

## 2017-04-21 DIAGNOSIS — K219 Gastro-esophageal reflux disease without esophagitis: Secondary | ICD-10-CM | POA: Diagnosis present

## 2017-04-21 DIAGNOSIS — Z7982 Long term (current) use of aspirin: Secondary | ICD-10-CM | POA: Diagnosis not present

## 2017-04-21 DIAGNOSIS — Z6836 Body mass index (BMI) 36.0-36.9, adult: Secondary | ICD-10-CM | POA: Diagnosis not present

## 2017-04-21 DIAGNOSIS — K8050 Calculus of bile duct without cholangitis or cholecystitis without obstruction: Secondary | ICD-10-CM | POA: Diagnosis present

## 2017-04-21 DIAGNOSIS — R7989 Other specified abnormal findings of blood chemistry: Secondary | ICD-10-CM | POA: Diagnosis present

## 2017-04-21 DIAGNOSIS — Z87440 Personal history of urinary (tract) infections: Secondary | ICD-10-CM | POA: Diagnosis not present

## 2017-04-21 DIAGNOSIS — Z880 Allergy status to penicillin: Secondary | ICD-10-CM

## 2017-04-21 DIAGNOSIS — Z90710 Acquired absence of both cervix and uterus: Secondary | ICD-10-CM | POA: Diagnosis not present

## 2017-04-21 DIAGNOSIS — Z87891 Personal history of nicotine dependence: Secondary | ICD-10-CM

## 2017-04-21 DIAGNOSIS — Z66 Do not resuscitate: Secondary | ICD-10-CM | POA: Diagnosis present

## 2017-04-21 DIAGNOSIS — Z882 Allergy status to sulfonamides status: Secondary | ICD-10-CM

## 2017-04-21 LAB — CREATININE, SERUM
CREATININE: 0.77 mg/dL (ref 0.44–1.00)
GFR calc Af Amer: >60 mL/min (ref >60)

## 2017-04-21 LAB — PROTIME-INR
INR: 1.17
Prothrombin Time: 15 seconds (ref 11.4–15.2)

## 2017-04-21 MED ORDER — ONDANSETRON HCL 4 MG PO TABS: 4.0000 mg | ORAL_TABLET | Freq: Four times a day (QID) | ORAL | Status: DC | PRN

## 2017-04-21 MED ORDER — SODIUM CHLORIDE 0.9 % IV SOLN
INTRAVENOUS | Status: DC
  Administered 2017-04-21 – 2017-04-23 (×4): via INTRAVENOUS

## 2017-04-21 MED ORDER — SODIUM CHLORIDE 0.9 % IV SOLN: INTRAVENOUS | Status: DC

## 2017-04-21 MED ORDER — AMLODIPINE BESYLATE 5 MG PO TABS
5.0000 mg | ORAL_TABLET | Freq: Every day | ORAL | Status: DC
  Administered 2017-04-22 – 2017-04-23 (×2): 5 mg via ORAL
  Filled 2017-04-21 (×2): qty 1

## 2017-04-21 MED ORDER — HYDRALAZINE HCL 20 MG/ML IJ SOLN: 10.0000 mg | Freq: Four times a day (QID) | INTRAMUSCULAR | Status: DC | PRN

## 2017-04-21 MED ORDER — ALPRAZOLAM 0.25 MG PO TABS: 0.2500 mg | ORAL_TABLET | Freq: Every evening | ORAL | Status: DC | PRN

## 2017-04-21 MED ORDER — HYDROCHLOROTHIAZIDE 25 MG PO TABS
25.0000 mg | ORAL_TABLET | Freq: Every day | ORAL | Status: DC
  Administered 2017-04-22: 25 mg via ORAL
  Filled 2017-04-21: qty 1

## 2017-04-21 MED ORDER — ACETAMINOPHEN 325 MG PO TABS
650.0000 mg | ORAL_TABLET | Freq: Four times a day (QID) | ORAL | Status: DC | PRN
  Administered 2017-04-22: 650 mg via ORAL
  Filled 2017-04-21: qty 2

## 2017-04-21 MED ORDER — ACETAMINOPHEN 650 MG RE SUPP: 650.0000 mg | Freq: Four times a day (QID) | RECTAL | Status: DC | PRN

## 2017-04-21 MED ORDER — ONDANSETRON HCL 4 MG/2ML IJ SOLN: 4.0000 mg | Freq: Four times a day (QID) | INTRAMUSCULAR | Status: DC | PRN

## 2017-04-21 MED ORDER — CIPROFLOXACIN IN D5W 400 MG/200ML IV SOLN
400.0000 mg | Freq: Once | INTRAVENOUS | Status: DC
  Filled 2017-04-21: qty 200

## 2017-04-21 MED ORDER — BISACODYL 5 MG PO TBEC: 5.0000 mg | DELAYED_RELEASE_TABLET | Freq: Every day | ORAL | Status: DC | PRN

## 2017-04-21 MED ORDER — ENOXAPARIN SODIUM 40 MG/0.4ML ~~LOC~~ SOLN
40.0000 mg | SUBCUTANEOUS | Status: DC
  Administered 2017-04-21 – 2017-04-22 (×2): 40 mg via SUBCUTANEOUS
  Filled 2017-04-21 (×2): qty 0.4

## 2017-04-21 MED ORDER — PANTOPRAZOLE SODIUM 40 MG PO TBEC
40.0000 mg | DELAYED_RELEASE_TABLET | Freq: Every day | ORAL | Status: DC
  Administered 2017-04-22 – 2017-04-23 (×2): 40 mg via ORAL
  Filled 2017-04-21 (×3): qty 1

## 2017-04-21 MED ORDER — SENNOSIDES-DOCUSATE SODIUM 8.6-50 MG PO TABS: 1.0000 | ORAL_TABLET | Freq: Every evening | ORAL | Status: DC | PRN

## 2017-04-21 MED ORDER — LOSARTAN POTASSIUM 50 MG PO TABS
100.0000 mg | ORAL_TABLET | Freq: Every day | ORAL | Status: DC
  Administered 2017-04-22 – 2017-04-23 (×2): 100 mg via ORAL
  Filled 2017-04-21 (×2): qty 2

## 2017-04-21 MED ORDER — METOPROLOL TARTRATE 25 MG PO TABS
25.0000 mg | ORAL_TABLET | Freq: Two times a day (BID) | ORAL | Status: DC
  Administered 2017-04-21 – 2017-04-23 (×4): 25 mg via ORAL
  Filled 2017-04-21 (×4): qty 1

## 2017-04-21 NOTE — ED Triage Notes (Signed)
Pt reports that she has been having intermittent abd pain, went to see dr Sabra Heck for pain and pt was ordered outpt [place], results show stones in pts common bile duct. Pt has had gallbladder removed. Pt also had lab work this am. Pt denies pain at this time. Denies NVD.

## 2017-04-21 NOTE — ED Notes (Addendum)
Pt states had US done by Dr. Sabra Heck today and was told she has gallstones but doesn't have a gallbladder. States pain comes and goes. R upper and R lower sided belly pain. States stones are in bile duct. Pt alert, oriented, no distress noted at this time. Pt states had blood work done by Dr. Sabra Heck today.   Pt states Dr. Sabra Heck sent pt to ED to be admitted to have surgery to get stones out either today or tomorrow.   Pt states pain on and off x 2 weeks. Denies N&V&D and fever.

## 2017-04-21 NOTE — Consult Note (Signed)
Jonathon Bellows MD  [address] [place], [place] [address] Phone: [phone] Fax : [phone]  Consultation  Referring Provider:     Dr Genia Harold  Primary Care Physician:  Rusty Aus, MD Primary Gastroenterologist:  Dr. Tiffany Kocher       Reason for Consultation:     Dilated common bile duct   Date of Admission:  04/21/2017 Date of Consultation:  04/21/2017         HPI:   Maria Manning is a 76 y.o. female who saw Dr Sabra Heck for RUQ pain , elevated LFT's, USG done today showed stones in the common bile duct as well as biliary dilation . She has had a cholecystectomy as well as an ERCP in the past . Many years back.   Denies any fever, pain presently. Says over the last one year has had RUQ pain,nausea after a meal , worse after fatty meals.    Past Medical History:  Diagnosis Date  . Elevated lipids   . GERD (gastroesophageal reflux disease)   . Hypertension   . PE (pulmonary embolism)     Past Surgical History:  Procedure Laterality Date  . ABDOMINAL HYSTERECTOMY     1993 1970's  . CHOLECYSTECTOMY    . COLONOSCOPY WITH PROPOFOL N/A 07/09/2016   Procedure: COLONOSCOPY WITH PROPOFOL;  Surgeon: Manya Silvas, MD;  Location: [facility] ENDOSCOPY;  Service: Endoscopy;  Laterality: N/A;  . EYE SURGERY     cataract od    Prior to Admission medications   Medication Sig Start Date End Date Taking? Authorizing Provider  ALPRAZolam Duanne Moron) 0.25 MG tablet Take 0.25 mg by mouth at bedtime as needed for anxiety.    Historical Provider, MD  amLODipine (NORVASC) 5 MG tablet Take 5 mg by mouth daily.    Historical Provider, MD  aspirin 81 MG tablet Take 81 mg by mouth daily.    Historical Provider, MD  calcium carbonate (OSCAL) 1500 (600 Ca) MG TABS tablet Take by mouth 2 (two) times daily with a meal.    Historical Provider, MD  hydrochlorothiazide (HYDRODIURIL) 25 MG tablet Take 25 mg by mouth daily.    Historical Provider, MD  losartan (COZAAR) 100 MG tablet Take 100 mg by mouth daily.     Historical Provider, MD  metoprolol tartrate (LOPRESSOR) 25 MG tablet Take 25 mg by mouth 2 (two) times daily.    Historical Provider, MD  Multiple Vitamin (MULTIVITAMIN) tablet Take 1 tablet by mouth daily.    Historical Provider, MD  ondansetron (ZOFRAN-ODT) 8 MG disintegrating tablet Take 8 mg by mouth every 8 (eight) hours as needed for nausea or vomiting.    Historical Provider, MD  pantoprazole (PROTONIX) 40 MG tablet Take 40 mg by mouth daily.    Historical Provider, MD    Family History  Problem Relation Age of Onset  . Breast cancer Maternal Aunt 65  . Breast cancer Mother 9     Social History  Substance Use Topics  . Smoking status: Former Research scientist (life sciences)  . Smokeless tobacco: Never Used  . Alcohol use No    Allergies as of 04/21/2017 - Review Complete 04/21/2017  Allergen Reaction Noted  . Ace inhibitors  07/08/2016  . Codeine  07/08/2016  . Penicillins  07/08/2016  . Prevacid [lansoprazole]  07/08/2016  . Ranitidine  07/08/2016  . Sulfur  07/08/2016    Review of Systems:    All systems reviewed and negative except where noted in HPI.   Physical Exam:  Vital signs in  last 24 hours: Temp:  [97.8 F (36.6 C)] 97.8 F (36.6 C) (04/24 1456) Pulse Rate:  [64-95] 64 (04/24 1544) Resp:  [14-18] 14 (04/24 1544) BP: (177-199)/(88-94) 177/88 (04/24 1544) SpO2:  [96 %-99 %] 99 % (04/24 1544) Weight:  [215 lb (97.5 kg)] 215 lb (97.5 kg) (04/24 1457)   General:   Pleasant, cooperative in NAD Head:  Normocephalic and atraumatic. Eyes:   No icterus.   Conjunctiva pink. PERRLA. Ears:  Normal auditory acuity. Neck:  Supple; no masses or thyroidomegaly Lungs: Respirations even and unlabored. Lungs clear to auscultation bilaterally.   No wheezes, crackles, or rhonchi.  Heart:  Regular rate and rhythm;  Without murmur, clicks, rubs or gallops Abdomen:  Soft, nondistended, nontender. Normal bowel sounds. No appreciable masses or hepatomegaly.  No rebound or guarding.  Rectal:  Not  performed. Extremities:  Without edema, cyanosis or clubbing. Neurologic:  Alert and oriented x3;  grossly normal neurologically. Psych:  Alert and cooperative. Normal affect.  LAB RESULTS: No results for input(s): WBC, HGB, HCT, PLT in the last 72 hours. BMET No results for input(s): NA, K, CL, CO2, GLUCOSE, BUN, CREATININE, CALCIUM in the last 72 hours. LFT No results for input(s): PROT, ALBUMIN, AST, ALT, ALKPHOS, BILITOT, BILIDIR, IBILI in the last 72 hours. PT/INR No results for input(s): LABPROT, INR in the last 72 hours.  STUDIES: US Abdomen Limited Ruq  Result Date: 04/21/2017 CLINICAL DATA:  Right upper quadrant pain with evidence of acute hepatitis EXAM: US ABDOMEN LIMITED - RIGHT UPPER QUADRANT COMPARISON:  CT abdomen and pelvis October 23, 2016 FINDINGS: Gallbladder: Surgically absent. Common bile duct: Diameter: 14 mm, dilated. There are 2 echogenic foci which shadow in the common bile duct consistent with choledocholithiasis. Largest of these foci measures Liver: No focal lesion identified. Within normal limits in parenchymal echogenicity. There are small foci of apparent air in the intrahepatic ducts. The patient appears to have had a previous sphincterotomy. IMPRESSION: Choledocholithiasis with dilated common bile duct. Small foci of apparent air in the intrahepatic biliary ducts, likely due to previous sphincterotomy. No focal liver lesions are evident. Gallbladder absent. Electronically Signed   By: Lowella Grip III M.D.   On: 04/21/2017 14:00      Impression / Plan:   Maria Manning is a 76 y.o. y/o female with RUQ pain , cholidocholithiasis on USG RUQ. LFT's awaited. No features of cholangitis.   Plan  1. Check LFT's today and tomorrow 2. INR to be checked 3. She will need an ERCP and will be scheduled either for tomorrow or day after based on DR [facility] availability. If she has fever or chills please call me asap as she will need the ERCP sooner .   4. Keep  NPO from midnight   Thank you for involving me in the care of this patient.      LOS: 0 days   Jonathon Bellows, MD  04/21/2017, 3:52 PM

## 2017-04-21 NOTE — Consult Note (Signed)
Patient ID: [name], female   DOB: 1941-08-16, 76 y.o.   MRN: [id number]  HPI Maria Manning is a 76 y.o. female seen in consultation at the request of Dr. Benjie Karvonen. She had a hx of lap chole more than 12 years ago by Dr. Tamala Julian complicated by retained CBD stone requiring ERCP.  She tells me since then she had intermittent right upper quadrant pain Pain is moderate and sharp.. More recently this pain exacerbated a couple weeks ago and the pain is intermittent and located in the right upper quadrant and associated with nausea. No fevers no chills no evidence of biliary obstruction or jaundice.  Further workup this included LFTs with increase in the alkaline phosphatase and normal bilirubin and creatinine. Her CBC was normal as well. Ultrasound I personally have reviewed it evidence of common bile duct dilation with a couple defects consistent with choledocholithiasis. Absent gallbladder.  HPI  Past Medical History:  Diagnosis Date  . Elevated lipids   . GERD (gastroesophageal reflux disease)   . Hypertension   . PE (pulmonary embolism)     Past Surgical History:  Procedure Laterality Date  . ABDOMINAL HYSTERECTOMY     1993 1970's  . CHOLECYSTECTOMY    . COLONOSCOPY WITH PROPOFOL N/A 07/09/2016   Procedure: COLONOSCOPY WITH PROPOFOL;  Surgeon: Manya Silvas, MD;  Location: [facility] ENDOSCOPY;  Service: Endoscopy;  Laterality: N/A;  . EYE SURGERY     cataract od    Family History  Problem Relation Age of Onset  . Breast cancer Maternal Aunt 65  . Breast cancer Mother 61    Social History Social History  Substance Use Topics  . Smoking status: Former Research scientist (life sciences)  . Smokeless tobacco: Never Used  . Alcohol use No    Allergies  Allergen Reactions  . Ace Inhibitors   . Codeine   . Morphine And Related Nausea And Vomiting  . Penicillins   . Prevacid [Lansoprazole]   . Ranitidine   . Sulfur     Current Facility-Administered Medications  Medication Dose Route Frequency  Provider Last Rate Last Dose  . 0.9 %  sodium chloride infusion   Intravenous Continuous Bettey Costa, MD 100 mL/hr at 04/21/17 1720    . 0.9 %  sodium chloride infusion   Intravenous Continuous Jonathon Bellows, MD      . acetaminophen (TYLENOL) tablet 650 mg  650 mg Oral Q6H PRN Bettey Costa, MD       Or  . acetaminophen (TYLENOL) suppository 650 mg  650 mg Rectal Q6H PRN Bettey Costa, MD      . ALPRAZolam Duanne Moron) tablet 0.25 mg  0.25 mg Oral QHS PRN Bettey Costa, MD      . Derrill Memo ON 04/22/2017] amLODipine (NORVASC) tablet 5 mg  5 mg Oral Daily Sital Mody, MD      . bisacodyl (DULCOLAX) EC tablet 5 mg  5 mg Oral Daily PRN Bettey Costa, MD      . ciprofloxacin (CIPRO) IVPB 400 mg  400 mg Intravenous Once Jonathon Bellows, MD      . enoxaparin (LOVENOX) injection 40 mg  40 mg Subcutaneous Q24H Sital Mody, MD      . hydrALAZINE (APRESOLINE) injection 10 mg  10 mg Intravenous Q6H PRN Bettey Costa, MD      . hydrochlorothiazide (HYDRODIURIL) tablet 25 mg  25 mg Oral Daily Bettey Costa, MD   25 mg at 04/21/17 1815  . [START ON 04/22/2017] losartan (COZAAR) tablet 100 mg  100 mg  Oral Daily Bettey Costa, MD      . metoprolol tartrate (LOPRESSOR) tablet 25 mg  25 mg Oral BID Bettey Costa, MD      . ondansetron (ZOFRAN) tablet 4 mg  4 mg Oral Q6H PRN Bettey Costa, MD       Or  . ondansetron (ZOFRAN) injection 4 mg  4 mg Intravenous Q6H PRN Sital Mody, MD      . pantoprazole (PROTONIX) EC tablet 40 mg  40 mg Oral Daily Bettey Costa, MD   40 mg at 04/21/17 1815  . senna-docusate (Senokot-S) tablet 1 tablet  1 tablet Oral QHS PRN Bettey Costa, MD         Review of Systems Full ROS  was asked and was negative except for the information on the HPI  Physical Exam Blood pressure (!) 179/77, pulse 73, temperature 98.2 F (36.8 C), temperature source Oral, resp. rate 18, height 5\' 4"  (1.626 m), weight 97.5 kg (215 lb), SpO2 99 %. CONSTITUTIONAL: NAD EYES: Pupils are equal, round, and reactive to light, Sclera are non-icteric. EARS, NOSE,  MOUTH AND THROAT: The oropharynx is clear. The oral mucosa is pink and moist. Hearing is intact to voice. LYMPH NODES:  Lymph nodes in the neck are normal. RESPIRATORY:  Lungs are clear. There is normal respiratory effort, with equal breath sounds bilaterally, and without pathologic use of accessory muscles. CARDIOVASCULAR: Heart is regular without murmurs, gallops, or rubs. GI: The abdomen is soft, nontender, and nondistended. There are no palpable masses. There is no hepatosplenomegaly. There are normal bowel sounds in all quadrants. GU: Rectal deferred.   MUSCULOSKELETAL: Normal muscle strength and tone. No cyanosis or edema.   SKIN: Turgor is good and there are no pathologic skin lesions or ulcers. NEUROLOGIC: Motor and sensation is grossly normal. Cranial nerves are grossly intact. PSYCH:  Oriented to person, place and time. Affect is normal.  Data Reviewed  I have personally reviewed the patient's imaging, laboratory findings and medical records.    Assessment Plan Recurrent choledocholithiasis. No evidence of cholangitis and no need for emergent surgical intervention. Recommend ERCP with the sternal extraction by GI. It is interesting case because raises the possibility of recurrent choledocholithiasis from the cystic duct stone versus primary [place] cholelithiasis. In either way to treatment will be initially same with decompression of the bladder system in the form of ERCP. Scars with the patient in detail about my thought process and her disease.  Caroleen Hamman, MD FACS General Surgeon 04/21/2017, 6:16 PM

## 2017-04-21 NOTE — ED Provider Notes (Signed)
[facility] Emergency Department Provider Note   ____________________________________________   First MD Initiated Contact with Patient 04/21/17 1521     (approximate)  I have reviewed the triage vital signs and the nursing notes.   HISTORY  Chief Complaint Abdominal Pain    HPI Maria Manning is a 76 y.o. female sees Dr. Emily Filbert and coronal clinic. He has been following her for right upper quadrant pain. She has had elevated liver functions. Today he saw her again repeated the blood work and did an ultrasound. The ultrasound shows some gallstones and a dilated biliary.. Patient has had her gallbladder out previously and has had one other ERCP for a retained stone. At the present time she is not having any pain.   Past Medical History:  Diagnosis Date  . Elevated lipids   . GERD (gastroesophageal reflux disease)   . Hypertension   . PE (pulmonary embolism)     Patient Active Problem List   Diagnosis Date Noted  . Gallstones 04/21/2017  . Choledocholithiasis     Past Surgical History:  Procedure Laterality Date  . ABDOMINAL HYSTERECTOMY     1993 1970's  . CHOLECYSTECTOMY    . COLONOSCOPY WITH PROPOFOL N/A 07/09/2016   Procedure: COLONOSCOPY WITH PROPOFOL;  Surgeon: Manya Silvas, MD;  Location: [facility] ENDOSCOPY;  Service: Endoscopy;  Laterality: N/A;  . EYE SURGERY     cataract od    Prior to Admission medications   Medication Sig Start Date End Date Taking? Authorizing Provider  amLODipine (NORVASC) 5 MG tablet Take 5 mg by mouth daily.   Yes Historical Provider, MD  aspirin 81 MG tablet Take 81 mg by mouth daily.   Yes Historical Provider, MD  calcium carbonate (OSCAL) 1500 (600 Ca) MG TABS tablet Take by mouth 2 (two) times daily with a meal.   Yes Historical Provider, MD  losartan (COZAAR) 100 MG tablet Take 100 mg by mouth daily.   Yes Historical Provider, MD  metoprolol tartrate (LOPRESSOR) 25 MG tablet Take 25 mg by mouth 2  (two) times daily.   Yes Historical Provider, MD  Multiple Vitamin (MULTIVITAMIN) tablet Take 1 tablet by mouth daily.   Yes Historical Provider, MD  omeprazole (PRILOSEC) 40 MG capsule Take 40 mg by mouth daily. 10/28/16 10/28/17 Yes Historical Provider, MD    Allergies   Family History  Problem Relation Age of Onset  . Breast cancer Maternal Aunt 65  . Breast cancer Mother 34    Social History Social History  Substance Use Topics  . Smoking status: Former Research scientist (life sciences)  . Smokeless tobacco: Never Used  . Alcohol use No    Review of Systems  Constitutional: No fever/chills Eyes: No visual changes. ENT: No sore throat. Cardiovascular: Denies chest pain. Respiratory: Denies shortness of breath. Gastrointestinal:See history of present illness Genitourinary: Negative for dysuria. Musculoskeletal: Negative for back pain. Skin: Negative for rash. Neurological: Negative for headaches, focal weakness or numbness.   ____________________________________________   PHYSICAL EXAM:  VITAL SIGNS: ED Triage Vitals  Enc Vitals Group     BP 04/21/17 1456 (!) 199/94     Pulse Rate 04/21/17 1456 95     Resp 04/21/17 1456 18     Temp 04/21/17 1456 97.8 F (36.6 C)     Temp Source 04/21/17 1456 Oral     SpO2 04/21/17 1456 96 %     Weight 04/21/17 1457 215 lb (97.5 kg)     Height 04/21/17 1457 5\' 4"  (  1.626 m)     Head Circumference --      Peak Flow --      Pain Score --      Pain Loc --      Pain Edu? --      Excl. in [place]? --     Constitutional: Alert and oriented. Well appearing and in no acute distress. Eyes: Conjunctivae are normal. PERRL. EOMI. Head: Atraumatic. Nose: No congestion/rhinnorhea. Mouth/Throat: Mucous membranes are moist.  Oropharynx non-erythematous. Neck: No stridor.  Cardiovascular: Normal rate, regular rhythm. Grossly normal heart sounds.  Good peripheral circulation. Respiratory: Normal respiratory effort.  No retractions. Lungs CTAB. Gastrointestinal:  Soft and nontender. No distention. No abdominal bruits. No CVA tenderness. Musculoskeletal: No lower extremity tenderness nor edema.  No joint effusions. Neurologic:  Normal speech and language. No gross focal neurologic deficits are appreciated. No gait instability. Skin:  Skin is warm, dry and intact. No rash noted. Psychiatric: Mood and affect are normal. Speech and behavior are normal.  ____________________________________________   LABS (all labs ordered are listed, but only abnormal results are displayed)  Labs Reviewed  PROTIME-INR  CREATININE, SERUM  COMPREHENSIVE METABOLIC PANEL  CBC   ____________________________________________  EKG   ____________________________________________  RADIOLOGY   ____________________________________________   PROCEDURES  Procedure(s) performed:   Procedures  Critical Care performed:   ____________________________________________   INITIAL IMPRESSION / ASSESSMENT AND PLAN / ED COURSE  Pertinent labs & imaging results that were available during my care of the patient were reviewed by me and considered in my medical decision making (see chart for details).        ____________________________________________   FINAL CLINICAL IMPRESSION(S) / ED DIAGNOSES  Final diagnoses:  Choledocholithiasis      NEW MEDICATIONS STARTED DURING THIS VISIT:  Current Discharge Medication List       Note:  This document was prepared using Dragon voice recognition software and may include unintentional dictation errors.    Nena Polio, MD 04/21/17 2013

## 2017-04-21 NOTE — H&P (Signed)
[facility] [place] NAME: Maria Manning    MR#:  [id number]  DATE OF BIRTH:  Apr 28, 1941  DATE OF ADMISSION:  04/21/2017  PRIMARY CARE PHYSICIAN: Rusty Aus, MD   REQUESTING/REFERRING PHYSICIAN: dr Cinda Quest  CHIEF COMPLAINT:   RUQ abdominal pain sent from PCP office HISTORY OF PRESENT ILLNESS:  Maria Manning  is a 76 y.o. female with a known history of Hypertension who presents from PCP office with chief complaint of right upper quadrant abdominal pain and choledocholithiasis seen on right upper quadrant abdominal ultrasound performed earlier this afternoon.  Patient was seen about 10 days ago with fever, chills and right upper chronic abdominal pain and at that time laboratory work showed abnormal liver function tests and UTI. She was given ciprofloxacin for urinary tract infection and had a follow-up visit to see her PCP today. She continues to have right upper quadrant abdominal pain after eating. She has had a cholecystectomy/appendectomy in the past. She denies nausea or vomiting today. She has had no fevers. Due to right upper quadrant pain her PCP ordered a ultrasound which showed choledocholithiasis and dilated common bile duct.  Lab work this afternoon showed elevated alkaline phosphatase at 123 and normal bilirubin. Normal BMP and WBC.  PAST MEDICAL HISTORY:   Past Medical History:  Diagnosis Date  . Elevated lipids   . GERD (gastroesophageal reflux disease)   . Hypertension   . PE (pulmonary embolism)     PAST SURGICAL HISTORY:   Past Surgical History:  Procedure Laterality Date  . ABDOMINAL HYSTERECTOMY     1993 1970's  . CHOLECYSTECTOMY    . COLONOSCOPY WITH PROPOFOL N/A 07/09/2016   Procedure: COLONOSCOPY WITH PROPOFOL;  Surgeon: Manya Silvas, MD;  Location: [facility] ENDOSCOPY;  Service: Endoscopy;  Laterality: N/A;  . EYE SURGERY     cataract od    SOCIAL HISTORY:   Social History  Substance Use Topics   . Smoking status: Former Research scientist (life sciences)  . Smokeless tobacco: Never Used  . Alcohol use No    FAMILY HISTORY:   Family History  Problem Relation Age of Onset  . Breast cancer Maternal Aunt 65  . Breast cancer Mother 81    DRUG ALLERGIES:   Allergies  Allergen Reactions  . Ace Inhibitors   . Codeine   . Penicillins   . Prevacid [Lansoprazole]   . Ranitidine   . Sulfur     REVIEW OF SYSTEMS:   Review of Systems  Constitutional: Negative.  Negative for chills, fever and malaise/fatigue.  HENT: Negative.  Negative for ear discharge, ear pain, hearing loss, nosebleeds and sore throat.   Eyes: Negative.  Negative for blurred vision and pain.  Respiratory: Negative.  Negative for cough, hemoptysis, shortness of breath and wheezing.   Cardiovascular: Negative.  Negative for chest pain, palpitations and leg swelling.  Gastrointestinal: Positive for abdominal pain. Negative for blood in stool, diarrhea, nausea and vomiting.  Genitourinary: Negative.  Negative for dysuria.  Musculoskeletal: Negative.  Negative for back pain.  Skin: Negative.   Neurological: Negative for dizziness, tremors, speech change, focal weakness, seizures and headaches.  Endo/Heme/Allergies: Negative.  Does not bruise/bleed easily.  Psychiatric/Behavioral: Negative.  Negative for depression, hallucinations and suicidal ideas.    MEDICATIONS AT HOME:   Prior to Admission medications   Medication Sig Start Date End Date Taking? Authorizing Provider  ALPRAZolam Duanne Moron) 0.25 MG tablet Take 0.25 mg by mouth at bedtime as needed for anxiety.  Historical Provider, MD  amLODipine (NORVASC) 5 MG tablet Take 5 mg by mouth daily.    Historical Provider, MD  aspirin 81 MG tablet Take 81 mg by mouth daily.    Historical Provider, MD  calcium carbonate (OSCAL) 1500 (600 Ca) MG TABS tablet Take by mouth 2 (two) times daily with a meal.    Historical Provider, MD  hydrochlorothiazide (HYDRODIURIL) 25 MG tablet Take 25 mg by  mouth daily.    Historical Provider, MD  losartan (COZAAR) 100 MG tablet Take 100 mg by mouth daily.    Historical Provider, MD  metoprolol tartrate (LOPRESSOR) 25 MG tablet Take 25 mg by mouth 2 (two) times daily.    Historical Provider, MD  Multiple Vitamin (MULTIVITAMIN) tablet Take 1 tablet by mouth daily.    Historical Provider, MD  ondansetron (ZOFRAN-ODT) 8 MG disintegrating tablet Take 8 mg by mouth every 8 (eight) hours as needed for nausea or vomiting.    Historical Provider, MD  pantoprazole (PROTONIX) 40 MG tablet Take 40 mg by mouth daily.    Historical Provider, MD      VITAL SIGNS:  Blood pressure (!) 177/88, pulse 64, temperature 97.8 F (36.6 C), temperature source Oral, resp. rate 14, height 5\' 4"  (1.626 m), weight 97.5 kg (215 lb), SpO2 99 %.  PHYSICAL EXAMINATION:   Physical Exam  Constitutional: She is oriented to person, place, and time and well-developed, well-nourished, and in no distress. No distress.  HENT:  Head: Normocephalic.  Eyes: No scleral icterus.  Neck: Normal range of motion. Neck supple. No JVD present. No tracheal deviation present.  Cardiovascular: Normal rate, regular rhythm and normal heart sounds.  Exam reveals no gallop and no friction rub.   No murmur heard. Pulmonary/Chest: Effort normal and breath sounds normal. No respiratory distress. She has no wheezes. She has no rales. She exhibits no tenderness.  Abdominal: Soft. Bowel sounds are normal. She exhibits no distension and no mass. There is tenderness (RUQ mild). There is no rebound and no guarding.  Musculoskeletal: Normal range of motion. She exhibits no edema.  Neurological: She is alert and oriented to person, place, and time.  Skin: Skin is warm. No rash noted. No erythema.  Psychiatric: Affect and judgment normal.      LABORATORY PANEL:   CBC No results for input(s): WBC, HGB, HCT, PLT in the last 168  hours. ------------------------------------------------------------------------------------------------------------------  Chemistries  No results for input(s): NA, K, CL, CO2, GLUCOSE, BUN, CREATININE, CALCIUM, MG, AST, ALT, ALKPHOS, BILITOT in the last 168 hours.  Invalid input(s): GFRCGP ------------------------------------------------------------------------------------------------------------------  Cardiac Enzymes No results for input(s): TROPONINI in the last 168 hours. ------------------------------------------------------------------------------------------------------------------  RADIOLOGY:  US Abdomen Limited Ruq  Result Date: 04/21/2017 CLINICAL DATA:  Right upper quadrant pain with evidence of acute hepatitis EXAM: US ABDOMEN LIMITED - RIGHT UPPER QUADRANT COMPARISON:  CT abdomen and pelvis October 23, 2016 FINDINGS: Gallbladder: Surgically absent. Common bile duct: Diameter: 14 mm, dilated. There are 2 echogenic foci which shadow in the common bile duct consistent with choledocholithiasis. Largest of these foci measures Liver: No focal lesion identified. Within normal limits in parenchymal echogenicity. There are small foci of apparent air in the intrahepatic ducts. The patient appears to have had a previous sphincterotomy. IMPRESSION: Choledocholithiasis with dilated common bile duct. Small foci of apparent air in the intrahepatic biliary ducts, likely due to previous sphincterotomy. No focal liver lesions are evident. Gallbladder absent. Electronically Signed   By: Lowella Grip III M.D.   On: 04/21/2017  14:00    EKG:     IMPRESSION AND PLAN:   76 year old female with a history of hypertension who presents with ongoing right upper quadrant abdominal pain and found to have choledocholithiasis and dilated common bile duct on right upper quadrant abdominal ulcers on today.  1. Choledocholithiasis: Patient will need ERCP. I have spoken with Dr. Wilhemena Durie who will arrange for  ERCP either tomorrow or Thursday morning with Dr.Wohl. Pain control  2. Hypertension: Patient may continue Norvasc, HCTZ losartan and metoprolol  3. GERD: Continue PPI  All the records are reviewed and case discussed with ED provider. Management plans discussed with the patient and family and they are in agreement  CODE STATUS: DNR  TOTAL TIME TAKING CARE OF THIS PATIENT: 45 minutes minutes.    Ronte Parker M.D on 04/21/2017 at 4:04 PM  Between 7am to 6pm - Pager - [phone]  After 6pm go to [email or web address] - password [email or web address] [facility]  Office  [phone]  CC: Primary care physician; Rusty Aus, MD

## 2017-04-22 ENCOUNTER — Encounter
Admission: EM | Disposition: A | Discharge status: Home / Self Care | Payer: Self-pay | Source: Home / Self Care | Attending: Internal Medicine

## 2017-04-22 ENCOUNTER — Inpatient Hospital Stay: Payer: Medicare Other

## 2017-04-22 ENCOUNTER — Inpatient Hospital Stay: Payer: Medicare Other | Admitting: Anesthesiology

## 2017-04-22 ENCOUNTER — Encounter: Payer: Self-pay | Admitting: *Deleted

## 2017-04-22 HISTORY — PX: ENDOSCOPIC RETROGRADE CHOLANGIOPANCREATOGRAPHY (ERCP) WITH PROPOFOL: SHX5810

## 2017-04-22 LAB — COMPREHENSIVE METABOLIC PANEL
ALT: 30 U/L (ref 14–54)
AST: 22 U/L (ref 15–41)
Albumin: 3.3 g/dL — ABNORMAL LOW (ref 3.5–5.0)
Alkaline Phosphatase: 113 U/L (ref 38–126)
Anion gap: 5 (ref 5–15)
BILIRUBIN TOTAL: 0.7 mg/dL (ref 0.3–1.2)
BUN: 9 mg/dL (ref 6–20)
CO2: 26 mmol/L (ref 22–32)
CREATININE: 0.72 mg/dL (ref 0.44–1.00)
Calcium: 8.5 mg/dL — ABNORMAL LOW (ref 8.9–10.3)
Chloride: 107 mmol/L (ref 101–111)
Glucose, Bld: 89 mg/dL (ref 65–99)
Potassium: 3.6 mmol/L (ref 3.5–5.1)
Sodium: 138 mmol/L (ref 135–145)
TOTAL PROTEIN: 6.1 g/dL — AB (ref 6.5–8.1)

## 2017-04-22 LAB — CBC
HCT: 32.5 % — ABNORMAL LOW (ref 35.0–47.0)
Hemoglobin: 11.1 g/dL — ABNORMAL LOW (ref 12.0–16.0)
MCH: 30.7 pg (ref 26.0–34.0)
MCHC: 34.3 g/dL (ref 32.0–36.0)
MCV: 89.6 fL (ref 80.0–100.0)
PLATELETS: 250 10*3/uL (ref 150–440)
RBC: 3.63 MIL/uL — AB (ref 3.80–5.20)
RDW: 14 % (ref 11.5–14.5)
WBC: 4.1 10*3/uL (ref 3.6–11.0)

## 2017-04-22 SURGERY — ENDOSCOPIC RETROGRADE CHOLANGIOPANCREATOGRAPHY (ERCP) WITH PROPOFOL
Anesthesia: General

## 2017-04-22 MED ORDER — PROPOFOL 500 MG/50ML IV EMUL
INTRAVENOUS | Status: DC | PRN
  Administered 2017-04-22: 150 ug/kg/min via INTRAVENOUS

## 2017-04-22 MED ORDER — FENTANYL CITRATE (PF) 100 MCG/2ML IJ SOLN
INTRAMUSCULAR | Status: AC
  Filled 2017-04-22: qty 2

## 2017-04-22 MED ORDER — INDOMETHACIN 50 MG RE SUPP
100.0000 mg | Freq: Once | RECTAL | Status: DC
  Filled 2017-04-22: qty 2

## 2017-04-22 MED ORDER — FENTANYL CITRATE (PF) 100 MCG/2ML IJ SOLN
INTRAMUSCULAR | Status: DC | PRN
  Administered 2017-04-22: 50 ug via INTRAVENOUS

## 2017-04-22 MED ORDER — PROPOFOL 10 MG/ML IV BOLUS
INTRAVENOUS | Status: AC
  Filled 2017-04-22: qty 20

## 2017-04-22 MED ORDER — LIDOCAINE HCL (CARDIAC) 20 MG/ML IV SOLN
INTRAVENOUS | Status: DC | PRN
  Administered 2017-04-22: 80 mg via INTRAVENOUS

## 2017-04-22 MED ORDER — GLYCOPYRROLATE 0.2 MG/ML IJ SOLN
INTRAMUSCULAR | Status: AC
  Filled 2017-04-22: qty 1

## 2017-04-22 MED ORDER — GLYCOPYRROLATE 0.2 MG/ML IJ SOLN
INTRAMUSCULAR | Status: DC | PRN
  Administered 2017-04-22: 0.2 mg via INTRAVENOUS

## 2017-04-22 MED ORDER — LIDOCAINE HCL (PF) 2 % IJ SOLN
INTRAMUSCULAR | Status: AC
  Filled 2017-04-22: qty 2

## 2017-04-22 MED ORDER — ONDANSETRON HCL 4 MG/2ML IJ SOLN
INTRAMUSCULAR | Status: DC | PRN
  Administered 2017-04-22: 4 mg via INTRAVENOUS

## 2017-04-22 MED ORDER — PROPOFOL 10 MG/ML IV BOLUS
INTRAVENOUS | Status: DC | PRN
  Administered 2017-04-22: 50 mg via INTRAVENOUS

## 2017-04-22 NOTE — Anesthesia Preprocedure Evaluation (Signed)
Anesthesia Evaluation  Patient identified by MRN, date of birth, ID band Patient awake    Reviewed: Allergy & Precautions, NPO status , Patient's Chart, lab work & pertinent test results  History of Anesthesia Complications Negative for: history of anesthetic complications  Airway Mallampati: II  TM Distance: >3 FB Neck ROM: Full    Dental  (+) Missing   Pulmonary neg sleep apnea, neg COPD, former smoker,    breath sounds clear to auscultation- rhonchi (-) wheezing      Cardiovascular Exercise Tolerance: Good hypertension, Pt. on medications (-) CAD and (-) Past MI  Rhythm:Regular Rate:Normal - Systolic murmurs and - Diastolic murmurs    Neuro/Psych negative neurological ROS  negative psych ROS   GI/Hepatic Neg liver ROS, GERD  ,  Endo/Other  negative endocrine ROSneg diabetes  Renal/GU negative Renal ROS     Musculoskeletal negative musculoskeletal ROS (+)   Abdominal (+) + obese,   Peds  Hematology negative hematology ROS (+)   Anesthesia Other Findings Past Medical History: No date: Elevated lipids No date: GERD (gastroesophageal reflux disease) No date: Hypertension No date: PE (pulmonary embolism)   Reproductive/Obstetrics                             Anesthesia Physical Anesthesia Plan  ASA: II  Anesthesia Plan: General   Post-op Pain Management:    Induction: Intravenous  Airway Management Planned: Natural Airway  Additional Equipment:   Intra-op Plan:   Post-operative Plan:   Informed Consent: I have reviewed the patients History and Physical, chart, labs and discussed the procedure including the risks, benefits and alternatives for the proposed anesthesia with the patient or authorized representative who has indicated his/her understanding and acceptance.   Dental advisory given  Plan Discussed with: CRNA and Anesthesiologist  Anesthesia Plan Comments:          Anesthesia Quick Evaluation

## 2017-04-22 NOTE — Progress Notes (Signed)
Kiran Anna MD [address] [place], [place] [address] Phone: [phone] Fax : [phone]  Maria Manning is being followed for cholidocholithiasis  Day 2 of follow up   Subjective: Doing well , no pain   Objective: Vital signs in last 24 hours: Vitals:   04/21/17 1654 04/21/17 2047 04/21/17 2049 04/22/17 0457  BP: (!) 179/77 (!) 173/80 (!) 177/81 (!) 169/61  Pulse: 73 80 84 63  Resp: 18 20  18  Temp: 98.2 F (36.8 C) 97.8 F (36.6 C)  97.4 F (36.3 C)  TempSrc: Oral Oral  Oral  SpO2: 99% 98% 99% 97%  Weight:      Height:       Weight change:   Intake/Output Summary (Last 24 hours) at 04/22/17 0913 Last data filed at 04/22/17 0856  Gross per 24 hour  Intake             1799 ml  Output             2050 ml  Net             -251 ml     Exam: Heart:: Regular rate and rhythm, S1S2 present or without murmur or extra heart sounds Lungs: normal, clear to auscultation and clear to auscultation and percussion Abdomen: soft, nontender, normal bowel sounds   Lab Results: @LABTEST2@ Micro Results: No results found for this or any previous visit (from the past 240 hour(s)). Studies/Results: Us Abdomen Limited Ruq  Result Date: 04/21/2017 CLINICAL DATA:  Right upper quadrant pain with evidence of acute hepatitis EXAM: US ABDOMEN LIMITED - RIGHT UPPER QUADRANT COMPARISON:  CT abdomen and pelvis October 23, 2016 FINDINGS: Gallbladder: Surgically absent. Common bile duct: Diameter: 14 mm, dilated. There are 2 echogenic foci which shadow in the common bile duct consistent with choledocholithiasis. Largest of these foci measures Liver: No focal lesion identified. Within normal limits in parenchymal echogenicity. There are small foci of apparent air in the intrahepatic ducts. The patient appears to have had a previous sphincterotomy. IMPRESSION: Choledocholithiasis with dilated common bile duct. Small foci of apparent air in the intrahepatic biliary ducts, likely due  to previous sphincterotomy. No focal liver lesions are evident. Gallbladder absent. Electronically Signed   By: William  Woodruff III M.D.   On: 04/21/2017 14:00   Medications: I have reviewed the patient's current medications. Scheduled Meds: . amLODipine  5 mg Oral Daily  . enoxaparin (LOVENOX) injection  40 mg Subcutaneous Q24H  . hydrochlorothiazide  25 mg Oral Daily  . losartan  100 mg Oral Daily  . metoprolol tartrate  25 mg Oral BID  . pantoprazole  40 mg Oral Daily   Continuous Infusions: . sodium chloride 100 mL/hr at 04/22/17 0226  . sodium chloride    . ciprofloxacin     PRN Meds:.acetaminophen **OR** acetaminophen, ALPRAZolam, bisacodyl, hydrALAZINE, ondansetron **OR** ondansetron (ZOFRAN) IV, senna-docusate Hepatic Function Latest Ref Rng & Units 04/22/2017  Total Protein 6.5 - 8.1 g/dL 6.1(L)  Albumin 3.5 - 5.0 g/dL 3.3(L)  AST 15 - 41 U/L 22  ALT 14 - 54 U/L 30  Alk Phosphatase 38 - 126 U/L 113  Total Bilirubin 0.3 - 1.2 mg/dL 0.7     Assessment: Active Problems:   Gallstones   Choledocholithiasis  Maria Manning is a 75 y.o. y/o female with RUQ pain , cholidocholithiasis on USG RUQ. LFT's show normal T bilirubun . No features of cholangitis.   Plan  1. ERCP tomorrow with Dr Wohl    2. Keep NPO from midnight  3. Can eat till midnight- orders placed   ` LOS: 1 day   Jonathon Bellows 04/22/2017, 9:13 AM

## 2017-04-22 NOTE — Anesthesia Post-op Follow-up Note (Cosign Needed)
Anesthesia QCDR form completed.        

## 2017-04-22 NOTE — Anesthesia Postprocedure Evaluation (Signed)
Anesthesia Post Note  Patient: Maria Manning  Procedure(s) Performed: Procedure(s) (LRB): ENDOSCOPIC RETROGRADE CHOLANGIOPANCREATOGRAPHY (ERCP) WITH PROPOFOL (N/A)  Patient location during evaluation: Endoscopy Anesthesia Type: General Level of consciousness: awake and alert and oriented Pain management: pain level controlled Vital Signs Assessment: post-procedure vital signs reviewed and stable Respiratory status: spontaneous breathing, nonlabored ventilation and respiratory function stable Cardiovascular status: blood pressure returned to baseline and stable Postop Assessment: no signs of nausea or vomiting Anesthetic complications: no     Last Vitals:  Vitals:   04/22/17 1151 04/22/17 1211  BP: 135/83 137/81  Pulse: 98 74  Resp: 15 16  Temp:      Last Pain:  Vitals:   04/22/17 1141  TempSrc: Tympanic                 Katilin Raynes

## 2017-04-22 NOTE — OR Nursing (Signed)
Pt stable . Transported back to 2c with orderly

## 2017-04-22 NOTE — OR Nursing (Signed)
Report called to erica on 2c. Pt s/p ercp. 4 biliary stones removed

## 2017-04-22 NOTE — Progress Notes (Signed)
[facility] [place] NAME: Maria Manning    MR#:  [id number]  DATE OF BIRTH:  1941-03-03  SUBJECTIVE:  Came in with abdominal pain. Found to have CBD dilatation No abd pain today   REVIEW OF SYSTEMS:   Review of Systems  Constitutional: Negative for chills, fever and weight loss.  HENT: Negative for ear discharge, ear pain and nosebleeds.   Eyes: Negative for blurred vision, pain and discharge.  Respiratory: Negative for sputum production, shortness of breath, wheezing and stridor.   Cardiovascular: Negative for chest pain, palpitations, orthopnea and PND.  Gastrointestinal: Negative for abdominal pain, diarrhea, nausea and vomiting.  Genitourinary: Negative for frequency and urgency.  Musculoskeletal: Negative for back pain and joint pain.  Neurological: Positive for weakness. Negative for sensory change, speech change and focal weakness.  Psychiatric/Behavioral: Negative for depression and hallucinations. The patient is not nervous/anxious.    Tolerating Diet: Tolerating PT:   DRUG ALLERGIES:   Allergies  Allergen Reactions  . Ace Inhibitors   . Codeine   . Morphine And Related Nausea And Vomiting  . Penicillins   . Prevacid [Lansoprazole]   . Ranitidine   . Sulfur     VITALS:  Blood pressure (!) 117/95, pulse 70, temperature 97.6 F (36.4 C), temperature source Oral, resp. rate 20, height 5\' 4"  (1.626 m), weight 97.5 kg (215 lb), SpO2 100 %.  PHYSICAL EXAMINATION:   Physical Exam  GENERAL:  76 y.o.-year-old patient lying in the bed with no acute distress.  EYES: Pupils equal, round, reactive to light and accommodation. No scleral icterus. Extraocular muscles intact.  HEENT: Head atraumatic, normocephalic. Oropharynx and nasopharynx clear.  NECK:  Supple, no jugular venous distention. No thyroid enlargement, no tenderness.  LUNGS: Normal breath sounds bilaterally, no wheezing, rales, rhonchi. No use of  accessory muscles of respiration.  CARDIOVASCULAR: S1, S2 normal. No murmurs, rubs, or gallops.  ABDOMEN: Soft, nontender, nondistended. Bowel sounds present. No organomegaly or mass.  EXTREMITIES: No cyanosis, clubbing or edema b/l.    NEUROLOGIC: Cranial nerves II through XII are intact. No focal Motor or sensory deficits b/l.   PSYCHIATRIC:  patient is alert and oriented x 3.  SKIN: No obvious rash, lesion, or ulcer.   LABORATORY PANEL:  CBC  Recent Labs Lab 04/22/17 0433  WBC 4.1  HGB 11.1*  HCT 32.5*  PLT 250    Chemistries   Recent Labs Lab 04/22/17 0433  NA 138  K 3.6  CL 107  CO2 26  GLUCOSE 89  BUN 9  CREATININE 0.72  CALCIUM 8.5*  AST 22  ALT 30  ALKPHOS 113  BILITOT 0.7   Cardiac Enzymes No results for input(s): TROPONINI in the last 168 hours. RADIOLOGY:  Dg C-arm 1-60 Min-no Report  Result Date: 04/22/2017 Fluoroscopy was utilized by the requesting physician.  No radiographic interpretation.   US Abdomen Limited Ruq  Result Date: 04/21/2017 CLINICAL DATA:  Right upper quadrant pain with evidence of acute hepatitis EXAM: US ABDOMEN LIMITED - RIGHT UPPER QUADRANT COMPARISON:  CT abdomen and pelvis October 23, 2016 FINDINGS: Gallbladder: Surgically absent. Common bile duct: Diameter: 14 mm, dilated. There are 2 echogenic foci which shadow in the common bile duct consistent with choledocholithiasis. Largest of these foci measures Liver: No focal lesion identified. Within normal limits in parenchymal echogenicity. There are small foci of apparent air in the intrahepatic ducts. The patient appears to have had a previous sphincterotomy. IMPRESSION: Choledocholithiasis with dilated  common bile duct. Small foci of apparent air in the intrahepatic biliary ducts, likely due to previous sphincterotomy. No focal liver lesions are evident. Gallbladder absent. Electronically Signed   By: Lowella Grip III M.D.   On: 04/21/2017 14:00   ASSESSMENT AND PLAN:   76 year old female with a history of hypertension who presents with ongoing right upper quadrant abdominal pain and found to have choledocholithiasis and dilated common bile duct on right upper quadrant abdominal ulcers on today.  1.Abdominal pain due to acute Choledocholithiasis:  Patient is s/p ERCP with removal of 4 stones and biliary sphincterotomy. Pain control  2. Hypertension: Patient may continue Norvasc, HCTZ losartan and metoprolol  3. GERD: Continue PPI  Spoke with pt and husband  Case discussed with Care Management/Social Worker. Management plans discussed with the patient, family and they are in agreement.  CODE STATUS: full  DVT Prophylaxis: lovenox  TOTAL TIME TAKING CARE OF THIS PATIENT: 30 minutes.  >50% time spent on counselling and coordination of care  POSSIBLE D/C IN 1-2 DAYS, DEPENDING ON CLINICAL CONDITION.  Note: This dictation was prepared with Dragon dictation along with smaller phrase technology. Any transcriptional errors that result from this process are unintentional.  Tenna Lacko M.D on 04/22/2017 at 12:33 PM  Between 7am to 6pm - Pager - [phone]  After 6pm go to [email or web address] - password [email or web address] [facility]  Office  [phone]  CC: Primary care physician; Rusty Aus, MD

## 2017-04-22 NOTE — Op Note (Addendum)
[facility] Gastroenterology Patient Name: Maria Manning Procedure Date: 04/22/2017 11:07 AM MRN: [id number] Account #: [id number] Date of Birth: 1941/11/07 Admit Type: Inpatient Age: 76 Room: [facility] ENDO ROOM 4 Gender: Female Note Status: Finalized Procedure:            ERCP Indications:          Bile duct stone(s) Providers:            Lucilla Lame MD, MD Referring MD:         Rusty Aus, MD (Referring MD) Medicines:            Propofol per Anesthesia Complications:        No immediate complications. Procedure:            Pre-Anesthesia Assessment:                       - Prior to the procedure, a History and Physical was                        performed, and patient medications and allergies were                        reviewed. The patient's tolerance of previous                        anesthesia was also reviewed. The risks and benefits of                        the procedure and the sedation options and risks were                        discussed with the patient. All questions were                        answered, and informed consent was obtained. Prior                        Anticoagulants: The patient has taken no previous                        anticoagulant or antiplatelet agents. ASA Grade                        Assessment: II - A patient with mild systemic disease.                        After reviewing the risks and benefits, the patient was                        deemed in satisfactory condition to undergo the                        procedure.                       After obtaining informed consent, the scope was passed                        under direct vision. Throughout the procedure, the  patient's blood pressure, pulse, and oxygen saturations                        were monitored continuously. The Endosonoscope was                        introduced through the mouth, and used to inject                        contrast  into and used to inject contrast into the bile                        duct. The ERCP was accomplished without difficulty. The                        patient tolerated the procedure well. Findings:      A scout film of the abdomen was obtained. Surgical clips were seen in       the area of the cystic duct. A biliary sphincterotomy had been       performed. The sphincterotomy appeared open. A wire was passed into the       biliary tree. The bile duct was deeply cannulated with the short-nosed       traction sphincterotome. Contrast was injected. I personally interpreted       the bile duct images. There was brisk flow of contrast through the       ducts. Image quality was excellent. Contrast extended to the entire       biliary tree. The lower third of the main bile duct contained three       stones mm. The biliary sphincterotomy was extended with a traction       (standard) sphincterotome using ERBE electrocautery. There was no       post-sphincterotomy bleeding. The biliary tree was swept with a 15 mm       balloon starting at the bifurcation. Four stones were removed. No stones       remained. Impression:           - Prior biliary endoscopic sphincterotomy appeared open.                       - Choledocholithiasis was found. Complete removal was                        accomplished by biliary sphincterotomy and balloon                        extraction.                       - A biliary sphincterotomy was performed.                       - The biliary tree was swept. Recommendation:       - Return patient to hospital ward for ongoing care. Procedure Code(s):    --- Professional ---                       [phone], Endoscopic retrograde cholangiopancreatography                        (ERCP); with removal of  calculi/debris from                        biliary/pancreatic duct(s)                       [phone], Endoscopic retrograde cholangiopancreatography                        (ERCP); with  sphincterotomy/papillotomy                       [phone], Endoscopic catheterization of the biliary ductal                        system, radiological supervision and interpretation Diagnosis Code(s):    --- Professional ---                       K80.50, Calculus of bile duct without cholangitis or                        cholecystitis without obstruction CPT copyright 2016 American [employer] Association. All rights reserved. The codes documented in this report are preliminary and upon coder review may  be revised to meet current compliance requirements. Lucilla Lame MD, MD 04/22/2017 11:34:45 AM This report has been signed electronically. Number of Addenda: 0 Note Initiated On: 04/22/2017 11:07 AM      [facility]

## 2017-04-22 NOTE — Transfer of Care (Signed)
Immediate Anesthesia Transfer of Care Note  Patient: Maria Manning  Procedure(s) Performed: Procedure(s): ENDOSCOPIC RETROGRADE CHOLANGIOPANCREATOGRAPHY (ERCP) WITH PROPOFOL (N/A)  Patient Location: PACU  Anesthesia Type:General  Level of Consciousness: awake, alert  and responds to stimulation  Airway & Oxygen Therapy: Patient Spontanous Breathing and Patient connected to nasal cannula oxygen  Post-op Assessment: Report given to RN and Post -op Vital signs reviewed and stable  Post vital signs: Reviewed and stable  Last Vitals:  Vitals:   04/22/17 1141 04/22/17 1142  BP:  (!) 110/94  Pulse:  (!) 108  Resp:  13  Temp: (P) 36.6 C     Last Pain:  Vitals:   04/22/17 1141  TempSrc: (P) Tympanic         Complications: No apparent anesthesia complications

## 2017-04-22 NOTE — Progress Notes (Signed)
CC: Choledocholithiasis Subjective: No abd pain, no nausea or vomiting. LFT reviewed and were nml. nml bili and alk phos, nml wbc Full ROS performed and was otherwise negative  Objective: Vital signs in last 24 hours: Temp:  [97.4 F (36.3 C)-98.2 F (36.8 C)] 97.4 F (36.3 C) (04/25 0457) Pulse Rate:  [63-95] 63 (04/25 0457) Resp:  [14-20] 18 (04/25 0457) BP: (169-199)/(61-94) 169/61 (04/25 0457) SpO2:  [96 %-99 %] 97 % (04/25 0457) Weight:  [97.5 kg (215 lb)] 97.5 kg (215 lb) (04/24 1457) Last BM Date: 04/21/17  Intake/Output from previous day: 04/24 0701 - 04/25 0700 In: [place] [P.O.:120; I.V.:1373] Out: 2050 [Urine:2050] Intake/Output this shift: No intake/output data recorded.  Physical exam: NAD, awake and alert Abd: soft, NT, no peritonitis Ext: no edema, well perfused Neuro: GCS 15, oriented, no motor or sensory deficits  Lab Results: CBC   Recent Labs  04/22/17 0433  WBC 4.1  HGB 11.1*  HCT 32.5*  PLT 250   BMET  Recent Labs  04/21/17 1846 04/22/17 0433  NA  --  138  K  --  3.6  CL  --  107  CO2  --  26  GLUCOSE  --  89  BUN  --  9  CREATININE 0.77 0.72  CALCIUM  --  8.5*   PT/INR  Recent Labs  04/21/17 1846  LABPROT 15.0  INR 1.17   ABG No results for input(s): PHART, HCO3 in the last 72 hours.  Invalid input(s): PCO2, PO2  Studies/Results: US Abdomen Limited Ruq  Result Date: 04/21/2017 CLINICAL DATA:  Right upper quadrant pain with evidence of acute hepatitis EXAM: US ABDOMEN LIMITED - RIGHT UPPER QUADRANT COMPARISON:  CT abdomen and pelvis October 23, 2016 FINDINGS: Gallbladder: Surgically absent. Common bile duct: Diameter: 14 mm, dilated. There are 2 echogenic foci which shadow in the common bile duct consistent with choledocholithiasis. Largest of these foci measures Liver: No focal lesion identified. Within normal limits in parenchymal echogenicity. There are small foci of apparent air in the intrahepatic ducts. The patient  appears to have had a previous sphincterotomy. IMPRESSION: Choledocholithiasis with dilated common bile duct. Small foci of apparent air in the intrahepatic biliary ducts, likely due to previous sphincterotomy. No focal liver lesions are evident. Gallbladder absent. Electronically Signed   By: Lowella Grip III M.D.   On: 04/21/2017 14:00    Anti-infectives: Anti-infectives    Start     Dose/Rate Route Frequency Ordered Stop   04/21/17 1800  ciprofloxacin (CIPRO) IVPB 400 mg     400 mg 200 mL/hr over 60 Minutes Intravenous  Once 04/21/17 1738        Assessment/Plan: Recurrent choledocholithiasis. No evidence of cholangitis and no need for emergent surgical intervention. ERCP today.    Caroleen Hamman, MD, [facility]  04/22/2017

## 2017-04-22 NOTE — Anesthesia Procedure Notes (Signed)
Performed by: Liyat Faulkenberry Pre-anesthesia Checklist: Patient identified, Emergency Drugs available, Suction available, Patient being monitored and Timeout performed Patient Re-evaluated:Patient Re-evaluated prior to inductionOxygen Delivery Method: Nasal cannula Preoxygenation: Pre-oxygenation with 100% oxygen Intubation Type: IV induction       

## 2017-04-23 ENCOUNTER — Encounter: Payer: Self-pay | Admitting: Gastroenterology

## 2017-04-23 MED ORDER — CALCIUM CARBONATE ANTACID 500 MG PO CHEW
600.0000 mg | CHEWABLE_TABLET | Freq: Two times a day (BID) | ORAL | Status: DC
  Filled 2017-04-23: qty 3

## 2017-04-23 MED ORDER — ADULT MULTIVITAMIN W/MINERALS CH
1.0000 | ORAL_TABLET | Freq: Every day | ORAL | Status: DC
  Administered 2017-04-23: 1 via ORAL
  Filled 2017-04-23: qty 1

## 2017-04-23 MED ORDER — ASPIRIN 81 MG PO CHEW
81.0000 mg | CHEWABLE_TABLET | Freq: Every day | ORAL | Status: DC
  Administered 2017-04-23: 81 mg via ORAL
  Filled 2017-04-23: qty 1

## 2017-04-23 NOTE — Progress Notes (Signed)
Reviewed discharge instructions with patient including medications and accessing my chart. Removed IV access.  Daughter on the way to pick her up

## 2017-04-23 NOTE — Discharge Summary (Signed)
Maria Manning NAME: Maria Manning    MR#:  [id number]  DATE OF BIRTH:  [birth year]  DATE OF ADMISSION:  04/21/2017 ADMITTING PHYSICIAN: Maria Costa, MD  DATE OF DISCHARGE: 04/23/17  PRIMARY CARE PHYSICIAN: Maria Aus, MD    ADMISSION DIAGNOSIS:  Choledocholithiasis [K80.50]  DISCHARGE DIAGNOSIS:  Recurrent Choledocholithiasis s/p ERCP with stone extraction  SECONDARY DIAGNOSIS:   Past Medical History:  Diagnosis Date  . Elevated lipids   . GERD (gastroesophageal reflux disease)   . Hypertension   . PE (pulmonary embolism)     HOSPITAL COURSE:   76 year old female with a history of hypertension who presents with ongoing right upper quadrant abdominal pain and found to have choledocholithiasis and dilated common bile duct on right upper quadrant abdominal ulcers on today.  1.Abdominal pain due to acute recurrent Choledocholithiasis:  Patient is s/p ERCP with removal of 4 stones and biliary sphincterotomy. -pt is asymptomatic -soft diet  2. Hypertension: - continue Norvasc, HCTZ losartan and metoprolol  3. GERD: Continue PPI  Spoke with pt and husband D/w Maria Manning D/c home. Pt agreeable CONSULTS OBTAINED:  Treatment Team:  Maria Husbands, MD Maria Bellows, MD  DRUG ALLERGIES:   Allergies  Allergen Reactions  . Ace Inhibitors   . Codeine   . Morphine And Related Nausea And Vomiting  . Penicillins   . Prevacid [Lansoprazole]   . Ranitidine   . Sulfur     DISCHARGE MEDICATIONS:   Current Discharge Medication List    CONTINUE these medications which have NOT CHANGED   Details  amLODipine (NORVASC) 5 MG tablet Take 5 mg by mouth daily.    aspirin 81 MG tablet Take 81 mg by mouth daily.    calcium carbonate (OSCAL) 1500 (600 Ca) MG TABS tablet Take by mouth 2 (two) times daily with a meal.    losartan (COZAAR) 100 MG tablet Take 100 mg by mouth daily.    metoprolol tartrate (LOPRESSOR) 25  MG tablet Take 25 mg by mouth 2 (two) times daily.    Multiple Vitamin (MULTIVITAMIN) tablet Take 1 tablet by mouth daily.    omeprazole (PRILOSEC) 40 MG capsule Take 40 mg by mouth daily.      STOP taking these medications     ALPRAZolam (XANAX) 0.25 MG tablet      pantoprazole (PROTONIX) 40 MG tablet         If you experience worsening of your admission symptoms, develop shortness of breath, life threatening emergency, suicidal or homicidal thoughts you must seek medical attention immediately by calling 911 or calling your MD immediately  if symptoms less severe.  You Must read complete instructions/literature along with all the possible adverse reactions/side effects for all the Medicines you take and that have been prescribed to you. Take any new Medicines after you have completely understood and accept all the possible adverse reactions/side effects.   Please note  You were cared for by a hospitalist during your hospital stay. If you have any questions about your discharge medications or the care you received while you were in the hospital after you are discharged, you can call the unit and asked to speak with the hospitalist on call if the hospitalist that took care of you is not available. Once you are discharged, your primary care physician will handle any further medical issues. Please note that NO REFILLS for any discharge medications will be authorized once you are discharged, as it  is imperative that you return to your primary care physician (or establish a relationship with a primary care physician if you do not have one) for your aftercare needs so that they can reassess your need for medications and monitor your lab values. Today   SUBJECTIVE   Doing well  VITAL SIGNS:  Blood pressure (!) 169/62, pulse 62, temperature 98.6 F (37 C), temperature source Oral, resp. rate 18, height 5\' 4"  (1.626 m), weight 97.5 kg (215 lb), SpO2 99 %.  I/O:   Intake/Output Summary (Last  24 hours) at 04/23/17 0853 Last data filed at 04/23/17 0556  Gross per 24 hour  Intake             2853 ml  Output             3200 ml  Net             -347 ml    PHYSICAL EXAMINATION:  GENERAL:  76 y.o.-year-old patient lying in the bed with no acute distress.  EYES: Pupils equal, round, reactive to light and accommodation. No scleral icterus. Extraocular muscles intact.  HEENT: Head atraumatic, normocephalic. Oropharynx and nasopharynx clear.  NECK:  Supple, no jugular venous distention. No thyroid enlargement, no tenderness.  LUNGS: Normal breath sounds bilaterally, no wheezing, rales,rhonchi or crepitation. No use of accessory muscles of respiration.  CARDIOVASCULAR: S1, S2 normal. No murmurs, rubs, or gallops.  ABDOMEN: Soft, non-tender, non-distended. Bowel sounds present. No organomegaly or mass.  EXTREMITIES: No pedal edema, cyanosis, or clubbing.  NEUROLOGIC: Cranial nerves II through XII are intact. Muscle strength 5/5 in all extremities. Sensation intact. Gait not checked.  PSYCHIATRIC: The patient is alert and oriented x 3.  SKIN: No obvious rash, lesion, or ulcer.   DATA REVIEW:   CBC   Recent Labs Lab 04/22/17 0433  WBC 4.1  HGB 11.1*  HCT 32.5*  PLT 250    Chemistries   Recent Labs Lab 04/22/17 0433  NA 138  K 3.6  CL 107  CO2 26  GLUCOSE 89  BUN 9  CREATININE 0.72  CALCIUM 8.5*  AST 22  ALT 30  ALKPHOS 113  BILITOT 0.7    Microbiology Results   No results found for this or any previous visit (from the past 240 hour(s)).  RADIOLOGY:  Dg C-arm 1-60 Min-no Report  Result Date: 04/22/2017 Fluoroscopy was utilized by the requesting physician.  No radiographic interpretation.   US Abdomen Limited Ruq  Result Date: 04/21/2017 CLINICAL DATA:  Right upper quadrant pain with evidence of acute hepatitis EXAM: US ABDOMEN LIMITED - RIGHT UPPER QUADRANT COMPARISON:  CT abdomen and pelvis October 23, 2016 FINDINGS: Gallbladder: Surgically absent.  Common bile duct: Diameter: 14 mm, dilated. There are 2 echogenic foci which shadow in the common bile duct consistent with choledocholithiasis. Largest of these foci measures Liver: No focal lesion identified. Within normal limits in parenchymal echogenicity. There are small foci of apparent air in the intrahepatic ducts. The patient appears to have had a previous sphincterotomy. IMPRESSION: Choledocholithiasis with dilated common bile duct. Small foci of apparent air in the intrahepatic biliary ducts, likely due to previous sphincterotomy. No focal liver lesions are evident. Gallbladder absent. Electronically Signed   By: Lowella Grip III M.D.   On: 04/21/2017 14:00     Management plans discussed with the patient, family and they are in agreement.  CODE STATUS:  Code Status History    This patient does not have a recorded code status. Please  follow your organizational policy for patients in this situation.    Advance Directive Documentation     Most Recent Value  Type of Advance Directive  Living will, Healthcare Power of Attorney  Pre-existing out of facility DNR order (yellow form or pink MOST form)  -  "MOST" Form in Place?  -      TOTAL TIME TAKING CARE OF THIS PATIENT:40 minutes.    Shenae Bonanno M.D on 04/23/2017 at 8:53 AM  Between 7am to 6pm - Pager - [phone] After 6pm go to [email or web address] - password [email or web address] [place] [facility]  Office  [phone]  CC: Primary care physician; Maria Aus, MD

## 2017-10-27 ENCOUNTER — Other Ambulatory Visit: Payer: Self-pay | Admitting: Internal Medicine

## 2017-10-27 DIAGNOSIS — Z1231 Encounter for screening mammogram for malignant neoplasm of breast: Secondary | ICD-10-CM

## 2017-12-25 ENCOUNTER — Ambulatory Visit
Admission: RE | Admit: 2017-12-25 | Discharge: 2017-12-25 | Disposition: A | Discharge status: Home / Self Care | Payer: Medicare Other | Source: Ambulatory Visit | Attending: Internal Medicine | Admitting: Internal Medicine

## 2017-12-25 DIAGNOSIS — Z1231 Encounter for screening mammogram for malignant neoplasm of breast: Secondary | ICD-10-CM | POA: Insufficient documentation

## 2018-11-18 ENCOUNTER — Other Ambulatory Visit: Payer: Self-pay | Admitting: Internal Medicine

## 2018-11-18 DIAGNOSIS — Z1231 Encounter for screening mammogram for malignant neoplasm of breast: Secondary | ICD-10-CM

## 2018-12-28 ENCOUNTER — Ambulatory Visit
Admission: RE | Admit: 2018-12-28 | Discharge: 2018-12-28 | Disposition: A | Discharge status: Home / Self Care | Payer: Medicare Other | Source: Ambulatory Visit | Attending: Internal Medicine | Admitting: Internal Medicine

## 2018-12-28 DIAGNOSIS — Z1231 Encounter for screening mammogram for malignant neoplasm of breast: Secondary | ICD-10-CM

## 2019-08-06 ENCOUNTER — Other Ambulatory Visit: Payer: Self-pay

## 2019-08-06 ENCOUNTER — Inpatient Hospital Stay
Admission: EM | Admit: 2019-08-06 | Discharge: 2019-08-08 | DRG: 641 | Disposition: A | Discharge status: Home Health | Payer: Medicare Other | Attending: Internal Medicine | Admitting: Internal Medicine

## 2019-08-06 ENCOUNTER — Encounter: Payer: Self-pay | Admitting: Emergency Medicine

## 2019-08-06 ENCOUNTER — Emergency Department: Payer: Medicare Other

## 2019-08-06 DIAGNOSIS — Z880 Allergy status to penicillin: Secondary | ICD-10-CM

## 2019-08-06 DIAGNOSIS — Z7982 Long term (current) use of aspirin: Secondary | ICD-10-CM

## 2019-08-06 DIAGNOSIS — Z803 Family history of malignant neoplasm of breast: Secondary | ICD-10-CM

## 2019-08-06 DIAGNOSIS — Z87891 Personal history of nicotine dependence: Secondary | ICD-10-CM

## 2019-08-06 DIAGNOSIS — E860 Dehydration: Secondary | ICD-10-CM | POA: Diagnosis present

## 2019-08-06 DIAGNOSIS — Z888 Allergy status to other drugs, medicaments and biological substances status: Secondary | ICD-10-CM

## 2019-08-06 DIAGNOSIS — E861 Hypovolemia: Secondary | ICD-10-CM | POA: Diagnosis present

## 2019-08-06 DIAGNOSIS — E871 Hypo-osmolality and hyponatremia: Secondary | ICD-10-CM | POA: Diagnosis not present

## 2019-08-06 DIAGNOSIS — T502X5A Adverse effect of carbonic-anhydrase inhibitors, benzothiadiazides and other diuretics, initial encounter: Secondary | ICD-10-CM | POA: Diagnosis present

## 2019-08-06 DIAGNOSIS — Z20828 Contact with and (suspected) exposure to other viral communicable diseases: Secondary | ICD-10-CM | POA: Diagnosis present

## 2019-08-06 DIAGNOSIS — Z885 Allergy status to narcotic agent status: Secondary | ICD-10-CM

## 2019-08-06 DIAGNOSIS — K219 Gastro-esophageal reflux disease without esophagitis: Secondary | ICD-10-CM | POA: Diagnosis present

## 2019-08-06 DIAGNOSIS — R531 Weakness: Secondary | ICD-10-CM

## 2019-08-06 DIAGNOSIS — R55 Syncope and collapse: Secondary | ICD-10-CM | POA: Diagnosis present

## 2019-08-06 DIAGNOSIS — I10 Essential (primary) hypertension: Secondary | ICD-10-CM | POA: Diagnosis present

## 2019-08-06 DIAGNOSIS — R0789 Other chest pain: Secondary | ICD-10-CM | POA: Diagnosis present

## 2019-08-06 DIAGNOSIS — Z79899 Other long term (current) drug therapy: Secondary | ICD-10-CM

## 2019-08-06 DIAGNOSIS — Z86711 Personal history of pulmonary embolism: Secondary | ICD-10-CM

## 2019-08-06 DIAGNOSIS — E785 Hyperlipidemia, unspecified: Secondary | ICD-10-CM | POA: Diagnosis present

## 2019-08-06 DIAGNOSIS — M1611 Unilateral primary osteoarthritis, right hip: Secondary | ICD-10-CM | POA: Diagnosis present

## 2019-08-06 LAB — BASIC METABOLIC PANEL
Anion gap: 13 (ref 5–15)
BUN: 12 mg/dL (ref 8–23)
CO2: 23 mmol/L (ref 22–32)
Calcium: 9.6 mg/dL (ref 8.9–10.3)
Chloride: 89 mmol/L — ABNORMAL LOW (ref 98–111)
Creatinine, Ser: 0.61 mg/dL (ref 0.44–1.00)
GFR calc Af Amer: >60 mL/min (ref >60)
GFR calc non Af Amer: >60 mL/min (ref >60)
Glucose, Bld: 133 mg/dL — ABNORMAL HIGH (ref 70–99)
Potassium: 3.6 mmol/L (ref 3.5–5.1)
Sodium: 125 mmol/L — ABNORMAL LOW (ref 135–145)

## 2019-08-06 LAB — CBC
HCT: 41.9 % (ref 36.0–46.0)
Hemoglobin: 14.6 g/dL (ref 12.0–15.0)
MCH: 31.1 pg (ref 26.0–34.0)
MCHC: 34.8 g/dL (ref 30.0–36.0)
MCV: 89.3 fL (ref 80.0–100.0)
Platelets: 333 10*3/uL (ref 150–400)
RBC: 4.69 MIL/uL (ref 3.87–5.11)
RDW: 13.3 % (ref 11.5–15.5)
WBC: 10.2 10*3/uL (ref 4.0–10.5)
nRBC: 0 % (ref 0.0–0.2)

## 2019-08-06 LAB — TROPONIN I (HIGH SENSITIVITY)
Troponin I (High Sensitivity): 5 ng/L (ref <18)
Troponin I (High Sensitivity): 5 ng/L (ref <18)
Troponin I (High Sensitivity): 7 ng/L (ref <18)
Troponin I (High Sensitivity): 7 ng/L (ref <18)

## 2019-08-06 LAB — GLUCOSE, CAPILLARY: Glucose-Capillary: 128 mg/dL — ABNORMAL HIGH (ref 70–99)

## 2019-08-06 LAB — TSH: TSH: 1.387 u[IU]/mL (ref 0.350–4.500)

## 2019-08-06 MED ORDER — ASPIRIN EC 81 MG PO TBEC
81.0000 mg | DELAYED_RELEASE_TABLET | Freq: Every day | ORAL | Status: DC
  Administered 2019-08-07 – 2019-08-08 (×2): 81 mg via ORAL
  Filled 2019-08-06 (×2): qty 1

## 2019-08-06 MED ORDER — ONDANSETRON HCL 4 MG/2ML IJ SOLN: 4.0000 mg | Freq: Four times a day (QID) | INTRAMUSCULAR | Status: DC | PRN

## 2019-08-06 MED ORDER — SODIUM CHLORIDE 0.9 % IV SOLN
INTRAVENOUS | Status: DC
  Administered 2019-08-06 – 2019-08-08 (×4): via INTRAVENOUS

## 2019-08-06 MED ORDER — PANTOPRAZOLE SODIUM 40 MG PO TBEC
40.0000 mg | DELAYED_RELEASE_TABLET | Freq: Every day | ORAL | Status: DC
  Administered 2019-08-07 – 2019-08-08 (×2): 40 mg via ORAL
  Filled 2019-08-06 (×2): qty 1

## 2019-08-06 MED ORDER — IOHEXOL 350 MG/ML SOLN
75.0000 mL | Freq: Once | INTRAVENOUS | Status: AC | PRN
  Administered 2019-08-06: 75 mL via INTRAVENOUS

## 2019-08-06 MED ORDER — ACETAMINOPHEN 650 MG RE SUPP: 650.0000 mg | Freq: Four times a day (QID) | RECTAL | Status: DC | PRN

## 2019-08-06 MED ORDER — ONDANSETRON HCL 4 MG PO TABS: 4.0000 mg | ORAL_TABLET | Freq: Four times a day (QID) | ORAL | Status: DC | PRN

## 2019-08-06 MED ORDER — METOPROLOL TARTRATE 25 MG PO TABS
25.0000 mg | ORAL_TABLET | Freq: Two times a day (BID) | ORAL | Status: DC
  Administered 2019-08-06 – 2019-08-08 (×4): 25 mg via ORAL
  Filled 2019-08-06 (×4): qty 1

## 2019-08-06 MED ORDER — ENOXAPARIN SODIUM 40 MG/0.4ML ~~LOC~~ SOLN
40.0000 mg | SUBCUTANEOUS | Status: DC
  Administered 2019-08-06 – 2019-08-07 (×2): 40 mg via SUBCUTANEOUS
  Filled 2019-08-06 (×2): qty 0.4

## 2019-08-06 MED ORDER — ADULT MULTIVITAMIN W/MINERALS CH
1.0000 | ORAL_TABLET | Freq: Every day | ORAL | Status: DC
  Administered 2019-08-07 – 2019-08-08 (×2): 1 via ORAL
  Filled 2019-08-06 (×2): qty 1

## 2019-08-06 MED ORDER — AMLODIPINE BESYLATE 5 MG PO TABS
5.0000 mg | ORAL_TABLET | Freq: Every day | ORAL | Status: DC
  Administered 2019-08-07 – 2019-08-08 (×2): 5 mg via ORAL
  Filled 2019-08-06 (×2): qty 1

## 2019-08-06 MED ORDER — SODIUM CHLORIDE 0.9 % IV SOLN
Freq: Once | INTRAVENOUS | Status: AC
  Administered 2019-08-06: 17:00:00 via INTRAVENOUS

## 2019-08-06 MED ORDER — LOSARTAN POTASSIUM 50 MG PO TABS
100.0000 mg | ORAL_TABLET | Freq: Every day | ORAL | Status: DC
  Administered 2019-08-07 – 2019-08-08 (×2): 100 mg via ORAL
  Filled 2019-08-06 (×2): qty 2

## 2019-08-06 MED ORDER — SODIUM CHLORIDE 0.9% FLUSH: 3.0000 mL | Freq: Once | INTRAVENOUS | Status: DC

## 2019-08-06 MED ORDER — ACETAMINOPHEN 325 MG PO TABS
650.0000 mg | ORAL_TABLET | Freq: Four times a day (QID) | ORAL | Status: DC | PRN
  Administered 2019-08-06 – 2019-08-08 (×4): 650 mg via ORAL
  Filled 2019-08-06 (×4): qty 2

## 2019-08-06 NOTE — ED Triage Notes (Signed)
Pt arrived via POV with reports of sudden sharp chest pain that started while she was sitting in her recliner. Pt denies any shortness of breath or nausea.  ON arrival to ED, pt became pale and had near syncopal episode when coming in to register.   PT recently taking medication for sciatic nerve.

## 2019-08-06 NOTE — Progress Notes (Signed)
   [facility] Day: 0 days Maria Manning is a 78 y.o. female presenting with Chest Pain and Near Syncope .   Advance care planning discussed with patient at bedside.  Patient has history of hypertension, hyperlipidemia, GERD. She is alert and oriented and stays at home with her daughter.  Independent otherwise at baseline. She is being admitted for hyponatremia and weakness All questions in regards to overall condition and expected prognosis answered. The decision was made to continue current code status.  She said she would like to be resuscitated for any reversible causes but would not want to stay on the vent for too long.  CODE STATUS: Full code Time spent: 18 minutes

## 2019-08-06 NOTE — ED Provider Notes (Signed)
[facility] Emergency Department Provider Note  Time seen: 3:35 PM  I have reviewed the triage vital signs and the nursing notes.   HISTORY  Chief Complaint Chest Pain and Near Syncope   HPI Maria Manning is a 78 y.o. female with a past medical history of gastric reflux, hypertension, PE, presents to the emergency department for chest pain.  According to the patient she was sitting in her recliner when she had acute onset of sharp chest pain and felt a flushing of warm sensation across her body.  Patient states her chest pain lasted approximately 10 minutes and then resolved.  Patient continues to feel weak, states for the last few days she has been feeling very weak with no appetite.  Felt very lightheaded with a near syncopal event shortly after arrival.  Currently patient denies any chest pain.  Denies any shortness of breath cough or fever at any point.  No leg pain or swelling.  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   Past Medical History:  Diagnosis Date  . Elevated lipids   . GERD (gastroesophageal reflux disease)   . Hypertension   . PE (pulmonary embolism)     Patient Active Problem List   Diagnosis Date Noted  . Gallstones 04/21/2017  . Choledocholithiasis     Past Surgical History:  Procedure Laterality Date  . ABDOMINAL HYSTERECTOMY     1993 1970's  . CHOLECYSTECTOMY    . COLONOSCOPY WITH PROPOFOL N/A 07/09/2016   Procedure: COLONOSCOPY WITH PROPOFOL;  Surgeon: Manya Silvas, MD;  Location: [facility] ENDOSCOPY;  Service: Endoscopy;  Laterality: N/A;  . ENDOSCOPIC RETROGRADE CHOLANGIOPANCREATOGRAPHY (ERCP) WITH PROPOFOL N/A 04/22/2017   Procedure: ENDOSCOPIC RETROGRADE CHOLANGIOPANCREATOGRAPHY (ERCP) WITH PROPOFOL;  Surgeon: Lucilla Lame, MD;  Location: [facility] ENDOSCOPY;  Service: Endoscopy;  Laterality: N/A;  . EYE SURGERY     cataract od    Prior to Admission medications   Medication Sig  Start Date End Date Taking? Authorizing Provider  amLODipine (NORVASC) 5 MG tablet Take 5 mg by mouth daily.    [provider]  aspirin 81 MG tablet Take 81 mg by mouth daily.    [provider]  calcium carbonate (OSCAL) 1500 (600 Ca) MG TABS tablet Take by mouth 2 (two) times daily with a meal.    [provider]  losartan (COZAAR) 100 MG tablet Take 100 mg by mouth daily.    [provider]  metoprolol tartrate (LOPRESSOR) 25 MG tablet Take 25 mg by mouth 2 (two) times daily.    [provider]  Multiple Vitamin (MULTIVITAMIN) tablet Take 1 tablet by mouth daily.    [provider]  omeprazole (PRILOSEC) 40 MG capsule Take 40 mg by mouth daily. 10/28/16 10/28/17  [provider]    Allergies  Allergen Reactions  . Ace Inhibitors Cough  . Codeine Nausea Only and Nausea And Vomiting  . Lansoprazole Other (See Comments)  . Morphine And Related Nausea And Vomiting  . Penicillins   . Ranitidine   . Ranitidine Hcl Other (See Comments)    "felt weak and jittery"  . Sulfur Other (See Comments)  . Telmisartan Other (See Comments)    Adverse effects  . Penicillin V Potassium Rash    Family History  Problem Relation Age of Onset  . Breast cancer Maternal Aunt 65  . Breast cancer Mother 8    Social History Social History   Tobacco  Use  . Smoking status: Former Smoker  . Smokeless tobacco: Never Used  Substance Use Topics  . Alcohol use: No  . Drug use: No    Review of Systems Constitutional: Negative for fever Cardiovascular: Positive for sharp central chest pain, moderate in severity lasting approximately 10 minutes and then resolving. Respiratory: Negative for shortness of breath.  Negative for cough. Gastrointestinal: Negative for abdominal pain, vomiting Musculoskeletal: Negative for leg pain or swelling. Skin: Negative for skin complaints  Neurological: Negative for headache All other ROS  negative  ____________________________________________   PHYSICAL EXAM:  VITAL SIGNS: ED Triage Vitals  Enc Vitals Group     BP 08/06/19 1304 (!) 146/85     Pulse Rate 08/06/19 1304 (!) 105     Resp 08/06/19 1304 20     Temp 08/06/19 1304 98 F (36.7 C)     Temp Source 08/06/19 1304 Oral     SpO2 08/06/19 1304 95 %     Weight 08/06/19 1305 225 lb (102.1 kg)     Height 08/06/19 1305 5\' 4"  (1.626 m)     Head Circumference --      Peak Flow --      Pain Score 08/06/19 1302 0     Pain Loc --      Pain Edu? --      Excl. in [place]? --    Constitutional: Alert and oriented. Well appearing and in no distress. Eyes: Normal exam ENT      Head: Normocephalic and atraumatic.      Mouth/Throat: Mucous membranes are moist. Cardiovascular: Normal rate, regular rhythm. No murmur Respiratory: Normal respiratory effort without tachypnea nor retractions. Breath sounds are clear Gastrointestinal: Soft and nontender. No distention.   Musculoskeletal: Nontender with normal range of motion in all extremities.  No leg tenderness or edema. Neurologic:  Normal speech and language. No gross focal neurologic deficits  Skin:  Skin is warm, dry and intact.  Psychiatric: Mood and affect are normal.   ____________________________________________    EKG  EKG viewed and interpreted by myself shows a sinus tachycardia 109 bpm with a narrow QRS, normal axis, largely normal intervals, nonspecific ST changes, lower amplitude.  ____________________________________________    RADIOLOGY  CTA negative for PE.  ____________________________________________   INITIAL IMPRESSION / ASSESSMENT AND PLAN / ED COURSE  Pertinent labs & imaging results that were available during my care of the patient were reviewed by me and considered in my medical decision making (see chart for details).   Patient presents to the emergency department for central sharp chest pain lasting approximately 10 minutes and then  resolving.  History of 2 prior PEs.  We will check labs including cardiac enzymes.  We will obtain a CTA of the chest to rule out PE.  Patient's lab work has resulted showing a sodium of 125, likely the cause of the patient's weakness and decreased appetite.  We will start the patient on a normal saline infusion.  Anticipate likely admission to the hospital given her weakness and hyponatremia.  Differential for the patient's chest pain would include chest wall pain, ACS, PE.  CTA pending.  Initial troponin reassuring.  Work-up is largely reassuring besides a sodium of 125.  CTA negative.  Given the patient's reported weakness and hyponatremia we will start the patient on normal saline infusion and admit to the hospital service for further treatment.  Maria Manning was evaluated in Emergency Department on 08/06/2019 for the symptoms described in the history of  present illness. She was evaluated in the context of the global COVID-19 pandemic, which necessitated consideration that the patient might be at risk for infection with the SARS-CoV-2 virus that causes COVID-19. Institutional protocols and algorithms that pertain to the evaluation of patients at risk for COVID-19 are in a state of rapid change based on information released by regulatory bodies including the CDC and federal and state organizations. These policies and algorithms were followed during the patient's care in the ED.  ____________________________________________   FINAL CLINICAL IMPRESSION(S) / ED DIAGNOSES  Chest pain Hyponatremia   Harvest Dark, MD 08/06/19 1702

## 2019-08-06 NOTE — ED Notes (Signed)
ED TO INPATIENT HANDOFF REPORT  ED Nurse Name and Phone #: Madaline Guthrie Name/Age/Gender [name] 78 y.o. female Room/Bed: ED03A/ED03A  Code Status   Code Status: Not on file  Home/SNF/Other Home Patient oriented to: self, place, time and situation Is this baseline? Yes   Triage Complete: Triage complete  Chief Complaint chest pains  Triage Note Pt arrived via POV with reports of sudden sharp chest pain that started while she was sitting in her recliner. Pt denies any shortness of breath or nausea.  ON arrival to ED, pt became pale and had near syncopal episode when coming in to register.   PT recently taking medication for sciatic nerve.    Allergies Allergies  Allergen Reactions  . Ace Inhibitors Cough  . Codeine Nausea Only and Nausea And Vomiting  . Lansoprazole Other (See Comments)  . Morphine And Related Nausea And Vomiting  . Penicillins   . Ranitidine   . Ranitidine Hcl Other (See Comments)    "felt weak and jittery"  . Sulfur Other (See Comments)  . Telmisartan Other (See Comments)    Adverse effects  . Penicillin V Potassium Rash    Level of Care/Admitting Diagnosis ED Disposition    ED Disposition Condition [facility] Area: [place] [[id number]]  Level of Care: Med-Surg [16]  Covid Evaluation: Asymptomatic Screening Protocol (No Symptoms)  Diagnosis: Hyponatremia [371062]  Admitting Physician: [place] Lighter [[id number]]  Attending Physician: [place] Lighter [phone]  PT Class (Do Not Modify): Observation [104]  PT Acc Code (Do Not Modify): Observation [10022]       B Medical/Surgery History Past Medical History:  Diagnosis Date  . Elevated lipids   . GERD (gastroesophageal reflux disease)   . Hypertension   . PE (pulmonary embolism)    Past Surgical History:  Procedure Laterality Date  . ABDOMINAL HYSTERECTOMY     1993 1970's  . CHOLECYSTECTOMY    . COLONOSCOPY WITH PROPOFOL N/A  07/09/2016   Procedure: COLONOSCOPY WITH PROPOFOL;  Surgeon: Manya Silvas, MD;  Location: [facility] ENDOSCOPY;  Service: Endoscopy;  Laterality: N/A;  . ENDOSCOPIC RETROGRADE CHOLANGIOPANCREATOGRAPHY (ERCP) WITH PROPOFOL N/A 04/22/2017   Procedure: ENDOSCOPIC RETROGRADE CHOLANGIOPANCREATOGRAPHY (ERCP) WITH PROPOFOL;  Surgeon: Lucilla Lame, MD;  Location: [facility] ENDOSCOPY;  Service: Endoscopy;  Laterality: N/A;  . EYE SURGERY     cataract od     A IV Location/Drains/Wounds Patient Lines/Drains/Airways Status   Active Line/Drains/Airways    Name:   Placement date:   Placement time:   Site:   Days:   Peripheral IV 08/06/19 Right Antecubital   08/06/19    1316    Antecubital   less than 1          Intake/Output Last 24 hours No intake or output data in the 24 hours ending 08/06/19 1739  Labs/Imaging Results for orders placed or performed during the hospital encounter of 08/06/19 (from the past 48 hour(s))  Glucose, capillary     Status: Abnormal   Collection Time: 08/06/19  1:11 PM  Result Value Ref Range   Glucose-Capillary 128 (H) 70 - 99 mg/dL  Basic metabolic panel     Status: Abnormal   Collection Time: 08/06/19  1:16 PM  Result Value Ref Range   Sodium 125 (L) 135 - 145 mmol/L   Potassium 3.6 3.5 - 5.1 mmol/L   Chloride 89 (L) 98 - 111 mmol/L   CO2 23 22 - 32 mmol/L   Glucose, Bld  133 (H) 70 - 99 mg/dL   BUN 12 8 - 23 mg/dL   Creatinine, Ser 0.61 0.44 - 1.00 mg/dL   Calcium 9.6 8.9 - 10.3 mg/dL   GFR calc non Af Amer >60 >60 mL/min   GFR calc Af Amer >60 >60 mL/min   Anion gap 13 5 - 15    Comment: Performed at [facility], [address] [place], [place] [address]  CBC     Status: None   Collection Time: 08/06/19  1:16 PM  Result Value Ref Range   WBC 10.2 4.0 - 10.5 K/uL   RBC 4.69 3.87 - 5.11 MIL/uL   Hemoglobin 14.6 12.0 - 15.0 g/dL   HCT 41.9 36.0 - 46.0 %   MCV 89.3 80.0 - 100.0 fL   MCH 31.1 26.0 - 34.0 pg   MCHC 34.8 30.0 - 36.0 g/dL   RDW 13.3  11.5 - 15.5 %   Platelets 333 150 - 400 K/uL   nRBC 0.0 0.0 - 0.2 %    Comment: Performed at [facility], [place], [place] [address]  Troponin I (High Sensitivity)     Status: None   Collection Time: 08/06/19  1:16 PM  Result Value Ref Range   Troponin I (High Sensitivity) 5 <18 ng/L    Comment: (NOTE) Elevated high sensitivity troponin I (hsTnI) values and significant  changes across serial measurements may suggest ACS but many other  chronic and acute conditions are known to elevate hsTnI results.  Refer to the "Links" section for chest pain algorithms and additional  guidance. Performed at [facility], [place], [place] [address]   Troponin I (High Sensitivity)     Status: None   Collection Time: 08/06/19  3:43 PM  Result Value Ref Range   Troponin I (High Sensitivity) 5 <18 ng/L    Comment: (NOTE) Elevated high sensitivity troponin I (hsTnI) values and significant  changes across serial measurements may suggest ACS but many other  chronic and acute conditions are known to elevate hsTnI results.  Refer to the "Links" section for chest pain algorithms and additional  guidance. Performed at [facility], [place], [place] [address]    Ct Angio Chest Pe W And/or Wo Contrast  Result Date: 08/06/2019 CLINICAL DATA:  Sudden onset of sharp chest pain today. Near syncopal episode. History of pulmonary embolism. EXAM: CT ANGIOGRAPHY CHEST WITH CONTRAST TECHNIQUE: Multidetector CT imaging of the chest was performed using the standard protocol during bolus administration of intravenous contrast. Multiplanar CT image reconstructions and MIPs were obtained to evaluate the vascular anatomy. CONTRAST:  52mL OMNIPAQUE IOHEXOL 350 MG/ML SOLN COMPARISON:  Chest CTA 03/12/2015. FINDINGS: Cardiovascular: The pulmonary arteries are well opacified with contrast to the level of the subsegmental branches. There is no evidence of  acute pulmonary embolism. Atherosclerosis of the aorta, great vessels and coronary arteries without acute vascular findings. The heart size is normal. There is no pericardial effusion. Mediastinum/Nodes: There are no enlarged mediastinal, hilar or axillary lymph nodes.There is a stable small hiatal hernia. The trachea and thyroid gland appear unremarkable. Lungs/Pleura: There is no pleural effusion or pneumothorax. There is stable minimal scarring at both lung bases. Suspicious pulmonary nodule or confluent airspace opacity. Upper abdomen: The visualized upper abdomen appears stable without acute findings. There is stable mild biliary dilatation post cholecystectomy. Musculoskeletal/Chest wall: There is no chest wall mass or suspicious osseous finding. Stable mild chronic inferior endplate compression  deformity at T4. Review of the MIP images confirms the above findings. IMPRESSION: 1. No evidence of acute pulmonary embolism or other acute chest findings. 2. Coronary and Aortic Atherosclerosis (ICD10-I70.0). 3. Stable small hiatal hernia. Electronically Signed   By: Richardean Sale M.D.   On: 08/06/2019 16:26    Pending Labs Unresulted Labs (From admission, onward)    Start     Ordered   08/06/19 1536  SARS CORONAVIRUS 2 Nasal Swab Aptima Multi Swab  (Asymptomatic/Tier 2 Patients Labs)  Once,   STAT    Question Answer Comment  Is this test for diagnosis or screening Screening   Symptomatic for COVID-19 as defined by CDC No   Hospitalized for COVID-19 No   Admitted to ICU for COVID-19 No   Previously tested for COVID-19 No   Resident in a congregate (group) care setting No   Employed in healthcare setting No   Pregnant No      08/06/19 1535   08/06/19 1309  Urinalysis, Complete w Microscopic  ONCE - STAT,   STAT     08/06/19 1309   Signed and Held  Basic metabolic panel  Tomorrow morning,   R     Signed and Held   Signed and Held  CBC  Tomorrow morning,   R     Signed and Held   Signed and  Held  TSH  Once,   R     Signed and Held          Vitals/Pain Today's Vitals   08/06/19 1305 08/06/19 1525 08/06/19 1630 08/06/19 1700  BP:  (!) 166/79 136/86 (!) 132/96  Pulse:  85 87 89  Resp:  (!) 21 17 11   Temp:      TempSrc:      SpO2:  96% 97% 98%  Weight: 102.1 kg     Height: 5\' 4"  (1.626 m)     PainSc:        Isolation Precautions No active isolations  Medications Medications  sodium chloride flush (NS) 0.9 % injection 3 mL (3 mLs Intravenous Not Given 08/06/19 1530)  0.9 %  sodium chloride infusion ( Intravenous New Bag/Given 08/06/19 1633)  iohexol (OMNIPAQUE) 350 MG/ML injection 75 mL (75 mLs Intravenous Contrast Given 08/06/19 1558)    Mobility walks Low fall risk   Focused Assessments Cardiac Assessment Handoff:  Cardiac Rhythm: Normal sinus rhythm Lab Results  Component Value Date   TROPONINI <0.03 10/23/2016   No results found for: DDIMER Does the Patient currently have chest pain? No     R Recommendations: See Admitting Provider Note  Report given to:   Additional Notes:

## 2019-08-06 NOTE — ED Notes (Signed)
Assisted to toilet in room.  Admit room switched.

## 2019-08-06 NOTE — H&P (Signed)
[facility] [place] NAME: Maria Manning    MR#:  [id number]  DATE OF BIRTH:  May 06, 1941  DATE OF ADMISSION:  08/06/2019  PRIMARY CARE PHYSICIAN: Rusty Aus, MD   REQUESTING/REFERRING PHYSICIAN: Dr. Harvest Dark  CHIEF COMPLAINT:   Chief Complaint  Patient presents with  . Chest Pain  . Near Syncope    HISTORY OF PRESENT ILLNESS:  Maria Manning  is a 78 y.o. female with a known history of hypertension, GERD, history of PE currently not on anticoagulation, arthritis presents from home secondary to an episode of chest pain and worsening weakness this morning. Patient has  right hip arthritis and was having worsening pain in the past 4 weeks, she has seen her PCP about 3 weeks ago was given prednisone taper for 7 days, tizanidine and etodolac.  When she started taking the medications, she felt extremely nauseous, weak.  When she finished her prednisone and tizanidine her symptoms have improved her intake has improved.  This morning patient felt a twinge of chest pain nonradiating, associated with flushing and warm sensation followed by lightheadedness and near syncopal episode.  So she came to the emergency room.  Given her history of pulmonary embolism, patient had a CT angiogram done here which did not show any acute pulmonary embolism. Troponins are negative in the emergency room.  Labs indicated hyponatremia with sodium of 125.  Urine analysis is pending.  COVID-19 test is pending  PAST MEDICAL HISTORY:   Past Medical History:  Diagnosis Date  . Elevated lipids   . GERD (gastroesophageal reflux disease)   . Hypertension   . PE (pulmonary embolism)     PAST SURGICAL HISTORY:   Past Surgical History:  Procedure Laterality Date  . ABDOMINAL HYSTERECTOMY     1993 1970's  . CHOLECYSTECTOMY    . COLONOSCOPY WITH PROPOFOL N/A 07/09/2016   Procedure: COLONOSCOPY WITH PROPOFOL;  Surgeon: Manya Silvas, MD;  Location: [facility]  ENDOSCOPY;  Service: Endoscopy;  Laterality: N/A;  . ENDOSCOPIC RETROGRADE CHOLANGIOPANCREATOGRAPHY (ERCP) WITH PROPOFOL N/A 04/22/2017   Procedure: ENDOSCOPIC RETROGRADE CHOLANGIOPANCREATOGRAPHY (ERCP) WITH PROPOFOL;  Surgeon: Lucilla Lame, MD;  Location: [facility] ENDOSCOPY;  Service: Endoscopy;  Laterality: N/A;  . EYE SURGERY     cataract od    SOCIAL HISTORY:   Social History   Tobacco Use  . Smoking status: Former Research scientist (life sciences)  . Smokeless tobacco: Never Used  Substance Use Topics  . Alcohol use: No    FAMILY HISTORY:   Family History  Problem Relation Age of Onset  . Breast cancer Maternal Aunt 65  . Breast cancer Mother 22    DRUG ALLERGIES:   Allergies  Allergen Reactions  . Ace Inhibitors Cough  . Codeine Nausea Only and Nausea And Vomiting  . Lansoprazole Other (See Comments)  . Morphine And Related Nausea And Vomiting  . Penicillins   . Ranitidine   . Ranitidine Hcl Other (See Comments)    "felt weak and jittery"  . Sulfur Other (See Comments)  . Telmisartan Other (See Comments)    Adverse effects  . Penicillin V Potassium Rash    REVIEW OF SYSTEMS:   Review of Systems  Constitutional: Positive for malaise/fatigue. Negative for chills, fever and weight loss.  HENT: Negative for ear discharge, ear pain, hearing loss and nosebleeds.   Eyes: Negative for blurred vision, double vision and photophobia.  Respiratory: Negative for cough, hemoptysis, shortness of breath and wheezing.   Cardiovascular: Negative  for chest pain, palpitations, orthopnea and leg swelling.  Gastrointestinal: Negative for abdominal pain, constipation, diarrhea, heartburn, melena, nausea and vomiting.  Genitourinary: Negative for dysuria, frequency, hematuria and urgency.  Musculoskeletal: Positive for joint pain and myalgias. Negative for back pain and neck pain.  Skin: Negative for rash.  Neurological: Negative for dizziness, tingling, sensory change, speech change, focal weakness and  headaches.  Endo/Heme/Allergies: Does not bruise/bleed easily.  Psychiatric/Behavioral: Negative for depression.    MEDICATIONS AT HOME:   Prior to Admission medications   Medication Sig Start Date End Date Taking? Authorizing Provider  acetaminophen (TYLENOL) 500 MG tablet Take 500-1,000 mg by mouth every 6 (six) hours as needed for mild pain, moderate pain or fever.   Yes [provider]  ALPRAZolam (XANAX) 0.25 MG tablet Take 0.25 mg by mouth at bedtime as needed for sleep. 07/06/19  Yes [provider]  amLODipine (NORVASC) 5 MG tablet Take 5 mg by mouth daily.   Yes [provider]  aspirin 81 MG tablet Take 81 mg by mouth daily.   Yes [provider]  Calcium Carbonate-Vitamin D3 (CALCIUM 600-D) 600-400 MG-UNIT TABS Take 1 tablet by mouth 2 (two) times daily.    Yes [provider]  hydrochlorothiazide (HYDRODIURIL) 25 MG tablet Take 25 mg by mouth daily. 06/01/19  Yes [provider]  losartan (COZAAR) 100 MG tablet Take 100 mg by mouth daily.   Yes [provider]  metoprolol tartrate (LOPRESSOR) 25 MG tablet Take 25 mg by mouth 2 (two) times daily.   Yes [provider]  Multiple Vitamin (MULTIVITAMIN) tablet Take 1 tablet by mouth daily.   Yes [provider]  Omeprazole 20 MG TBEC Take 20 mg by mouth daily.    Yes [provider]      VITAL SIGNS:  Blood pressure (!) 149/92, pulse 79, temperature 98 F (36.7 C), temperature source Oral, resp. rate 14, height 5\' 4"  (1.626 m), weight 102.1 kg, SpO2 97 %.  PHYSICAL EXAMINATION:  Physical Exam  GENERAL:  78 y.o.-year-old elderly patient lying in the bed with no acute distress.  EYES: Pupils equal, round, reactive to light and accommodation. No scleral icterus. Extraocular muscles intact.  HEENT: Head atraumatic, normocephalic. Oropharynx and nasopharynx clear.  NECK:  Supple, no jugular venous distention. No thyroid enlargement, no tenderness.   LUNGS: Normal breath sounds bilaterally, no wheezing, rales,rhonchi or crepitation. No use of accessory muscles of respiration. Decreased bibasilar breath sounds CARDIOVASCULAR: S1, S2 normal. No  rubs, or gallops. 2/6 systolic murmur present ABDOMEN: Soft, nontender, nondistended. Bowel sounds present. No organomegaly or mass.  EXTREMITIES: No pedal edema, cyanosis, or clubbing.  NEUROLOGIC: Cranial nerves II through XII are intact. Muscle strength 5/5 in all extremities. Sensation intact. Gait not checked.  PSYCHIATRIC: The patient is alert and oriented x 3.  SKIN: No obvious rash, lesion, or ulcer.   LABORATORY PANEL:   CBC Recent Labs  Lab 08/06/19 1316  WBC 10.2  HGB 14.6  HCT 41.9  PLT 333   ------------------------------------------------------------------------------------------------------------------  Chemistries  Recent Labs  Lab 08/06/19 1316  NA 125*  K 3.6  CL 89*  CO2 23  GLUCOSE 133*  BUN 12  CREATININE 0.61  CALCIUM 9.6   ------------------------------------------------------------------------------------------------------------------  Cardiac Enzymes No results for input(s): TROPONINI in the last 168 hours. ------------------------------------------------------------------------------------------------------------------  RADIOLOGY:  Ct Angio Chest Pe W And/or Wo Contrast  Result Date: 08/06/2019 CLINICAL DATA:  Sudden onset of sharp chest pain today. Near syncopal episode. History  of pulmonary embolism. EXAM: CT ANGIOGRAPHY CHEST WITH CONTRAST TECHNIQUE: Multidetector CT imaging of the chest was performed using the standard protocol during bolus administration of intravenous contrast. Multiplanar CT image reconstructions and MIPs were obtained to evaluate the vascular anatomy. CONTRAST:  70mL OMNIPAQUE IOHEXOL 350 MG/ML SOLN COMPARISON:  Chest CTA 03/12/2015. FINDINGS: Cardiovascular: The pulmonary arteries are well opacified with contrast to the level of  the subsegmental branches. There is no evidence of acute pulmonary embolism. Atherosclerosis of the aorta, great vessels and coronary arteries without acute vascular findings. The heart size is normal. There is no pericardial effusion. Mediastinum/Nodes: There are no enlarged mediastinal, hilar or axillary lymph nodes.There is a stable small hiatal hernia. The trachea and thyroid gland appear unremarkable. Lungs/Pleura: There is no pleural effusion or pneumothorax. There is stable minimal scarring at both lung bases. Suspicious pulmonary nodule or confluent airspace opacity. Upper abdomen: The visualized upper abdomen appears stable without acute findings. There is stable mild biliary dilatation post cholecystectomy. Musculoskeletal/Chest wall: There is no chest wall mass or suspicious osseous finding. Stable mild chronic inferior endplate compression deformity at T4. Review of the MIP images confirms the above findings. IMPRESSION: 1. No evidence of acute pulmonary embolism or other acute chest findings. 2. Coronary and Aortic Atherosclerosis (ICD10-I70.0). 3. Stable small hiatal hernia. Electronically Signed   By: Richardean Sale M.D.   On: 08/06/2019 16:26      IMPRESSION AND PLAN:   Maria Manning  is a 78 y.o. female with a known history of hypertension, GERD, history of PE currently not on anticoagulation, arthritis presents from home secondary to an episode of chest pain and worsening weakness this morning.  1.  Weakness-likely secondary to hyponatremia.  Hypovolemic hyponatremia. -Gentle hydration and monitor -Physical therapy.  Admitted under observation for now -Check TSH and urine analysis as well -COVID-19 test is pending  2.  Chest pain-likely musculoskeletal pain.  Resolved completely.  EKG with no acute abnormalities and 2 sets of troponin are negative.  Monitor on telemetry  3.  GERD-Protonix  4.  Hypertension-patient on Norvasc  5.  DVT prophylaxis-Lovenox  Ambulatory at  baseline.  Physical therapy consulted for weakness    All the records are reviewed and case discussed with ED provider. Management plans discussed with the patient, family and they are in agreement.  CODE STATUS: Full Code  TOTAL TIME TAKING CARE OF THIS PATIENT: 52 minutes.    Gladstone Lighter M.D on 08/06/2019 at 5:50 PM  Between 7am to 6pm - Pager - [phone]  After 6pm go to [email or web address] - Proofreader  [facility]  Office  [phone]  CC: Primary care physician; Rusty Aus, MD   Note: This dictation was prepared with Dragon dictation along with smaller phrase technology. Any transcriptional errors that result from this process are unintentional.

## 2019-08-07 DIAGNOSIS — Z7982 Long term (current) use of aspirin: Secondary | ICD-10-CM | POA: Diagnosis not present

## 2019-08-07 DIAGNOSIS — Z86711 Personal history of pulmonary embolism: Secondary | ICD-10-CM | POA: Diagnosis not present

## 2019-08-07 DIAGNOSIS — E785 Hyperlipidemia, unspecified: Secondary | ICD-10-CM | POA: Diagnosis present

## 2019-08-07 DIAGNOSIS — K219 Gastro-esophageal reflux disease without esophagitis: Secondary | ICD-10-CM | POA: Diagnosis present

## 2019-08-07 DIAGNOSIS — T502X5A Adverse effect of carbonic-anhydrase inhibitors, benzothiadiazides and other diuretics, initial encounter: Secondary | ICD-10-CM | POA: Diagnosis present

## 2019-08-07 DIAGNOSIS — Z880 Allergy status to penicillin: Secondary | ICD-10-CM | POA: Diagnosis not present

## 2019-08-07 DIAGNOSIS — E861 Hypovolemia: Secondary | ICD-10-CM | POA: Diagnosis present

## 2019-08-07 DIAGNOSIS — R0789 Other chest pain: Secondary | ICD-10-CM | POA: Diagnosis present

## 2019-08-07 DIAGNOSIS — E860 Dehydration: Secondary | ICD-10-CM | POA: Diagnosis present

## 2019-08-07 DIAGNOSIS — Z20828 Contact with and (suspected) exposure to other viral communicable diseases: Secondary | ICD-10-CM | POA: Diagnosis present

## 2019-08-07 DIAGNOSIS — Z79899 Other long term (current) drug therapy: Secondary | ICD-10-CM | POA: Diagnosis not present

## 2019-08-07 DIAGNOSIS — Z888 Allergy status to other drugs, medicaments and biological substances status: Secondary | ICD-10-CM | POA: Diagnosis not present

## 2019-08-07 DIAGNOSIS — M1611 Unilateral primary osteoarthritis, right hip: Secondary | ICD-10-CM | POA: Diagnosis present

## 2019-08-07 DIAGNOSIS — I10 Essential (primary) hypertension: Secondary | ICD-10-CM | POA: Diagnosis present

## 2019-08-07 DIAGNOSIS — R55 Syncope and collapse: Secondary | ICD-10-CM | POA: Diagnosis present

## 2019-08-07 DIAGNOSIS — Z803 Family history of malignant neoplasm of breast: Secondary | ICD-10-CM | POA: Diagnosis not present

## 2019-08-07 DIAGNOSIS — E871 Hypo-osmolality and hyponatremia: Secondary | ICD-10-CM | POA: Diagnosis present

## 2019-08-07 DIAGNOSIS — Z885 Allergy status to narcotic agent status: Secondary | ICD-10-CM | POA: Diagnosis not present

## 2019-08-07 DIAGNOSIS — Z87891 Personal history of nicotine dependence: Secondary | ICD-10-CM | POA: Diagnosis not present

## 2019-08-07 LAB — BASIC METABOLIC PANEL
Anion gap: 9 (ref 5–15)
BUN: 11 mg/dL (ref 8–23)
CO2: 22 mmol/L (ref 22–32)
Calcium: 8.7 mg/dL — ABNORMAL LOW (ref 8.9–10.3)
Chloride: 95 mmol/L — ABNORMAL LOW (ref 98–111)
Creatinine, Ser: 0.44 mg/dL (ref 0.44–1.00)
GFR calc Af Amer: >60 mL/min (ref >60)
GFR calc non Af Amer: >60 mL/min (ref >60)
Glucose, Bld: 96 mg/dL (ref 70–99)
Potassium: 3.7 mmol/L (ref 3.5–5.1)
Sodium: 126 mmol/L — ABNORMAL LOW (ref 135–145)

## 2019-08-07 LAB — CBC
HCT: 34.9 % — ABNORMAL LOW (ref 36.0–46.0)
Hemoglobin: 12.2 g/dL (ref 12.0–15.0)
MCH: 30.8 pg (ref 26.0–34.0)
MCHC: 35 g/dL (ref 30.0–36.0)
MCV: 88.1 fL (ref 80.0–100.0)
Platelets: 286 10*3/uL (ref 150–400)
RBC: 3.96 MIL/uL (ref 3.87–5.11)
RDW: 13.3 % (ref 11.5–15.5)
WBC: 7.3 10*3/uL (ref 4.0–10.5)
nRBC: 0 % (ref 0.0–0.2)

## 2019-08-07 LAB — SODIUM
Sodium: 126 mmol/L — ABNORMAL LOW (ref 135–145)
Sodium: 127 mmol/L — ABNORMAL LOW (ref 135–145)

## 2019-08-07 LAB — SARS CORONAVIRUS 2 (TAT 6-24 HRS): SARS Coronavirus 2: NEGATIVE

## 2019-08-07 LAB — URIC ACID: Uric Acid, Serum: 3.4 mg/dL (ref 2.5–7.1)

## 2019-08-07 MED ORDER — SODIUM CHLORIDE 0.9 % IV SOLN: INTRAVENOUS | Status: DC

## 2019-08-07 NOTE — Progress Notes (Signed)
[place] at [place] NAME: Maria Manning    MR#:  [id number]  DATE OF BIRTH:  [birth year]  SUBJECTIVE:  Patient here with weakness and found to have low sodium level  REVIEW OF SYSTEMS:    Review of Systems  Constitutional: Negative for fever, chills weight loss HENT: Negative for ear pain, nosebleeds, congestion, facial swelling, rhinorrhea, neck pain, neck stiffness and ear discharge.   Respiratory: Negative for cough, shortness of breath, wheezing  Cardiovascular: Negative for chest pain, palpitations and leg swelling.  Gastrointestinal: Negative for heartburn, abdominal pain, vomiting, diarrhea or consitpation Genitourinary: Negative for dysuria, urgency, frequency, hematuria Musculoskeletal: Negative for back pain or joint pain Neurological: Negative for dizziness, seizures, syncope, focal weakness,  numbness and headaches.  Hematological: Does not bruise/bleed easily.  Psychiatric/Behavioral: Negative for hallucinations, confusion, dysphoric mood    Tolerating Diet: yes      DRUG ALLERGIES:   Allergies  Allergen Reactions  . Ace Inhibitors Cough  . Codeine Nausea Only and Nausea And Vomiting  . Lansoprazole Other (See Comments)  . Morphine And Related Nausea And Vomiting  . Penicillins   . Ranitidine   . Ranitidine Hcl Other (See Comments)    "felt weak and jittery"  . Sulfur Other (See Comments)  . Telmisartan Other (See Comments)    Adverse effects  . Penicillin V Potassium Rash    VITALS:  Blood pressure (!) 153/79, pulse 77, temperature 97.8 F (36.6 C), temperature source Oral, resp. rate 18, height 5\' 4"  (1.626 m), weight 101.7 kg, SpO2 97 %.  PHYSICAL EXAMINATION:  Constitutional: Appears well-developed and well-nourished. No distress. HENT: Normocephalic. Marland Kitchen Oropharynx is clear and moist.  Eyes: Conjunctivae and EOM are normal. PERRLA, no scleral icterus.  Neck: Normal ROM. Neck supple. No JVD. No  tracheal deviation. CVS: RRR, S1/S2 +, no murmurs, no gallops, no carotid bruit.  Pulmonary: Effort and breath sounds normal, no stridor, rhonchi, wheezes, rales.  Abdominal: Soft. BS +,  no distension, tenderness, rebound or guarding.  Musculoskeletal: Normal range of motion. No edema and no tenderness.  Neuro: Alert. CN 2-12 grossly intact. No focal deficits. Skin: Skin is warm and dry. No rash noted. Psychiatric: Normal mood and affect.      LABORATORY PANEL:   CBC Recent Labs  Lab 08/07/19 0516  WBC 7.3  HGB 12.2  HCT 34.9*  PLT 286   ------------------------------------------------------------------------------------------------------------------  Chemistries  Recent Labs  Lab 08/07/19 0516  NA 126*  K 3.7  CL 95*  CO2 22  GLUCOSE 96  BUN 11  CREATININE 0.44  CALCIUM 8.7*   ------------------------------------------------------------------------------------------------------------------  Cardiac Enzymes No results for input(s): TROPONINI in the last 168 hours. ------------------------------------------------------------------------------------------------------------------  RADIOLOGY:  Ct Angio Chest Pe W And/or Wo Contrast  Result Date: 08/06/2019 CLINICAL DATA:  Sudden onset of sharp chest pain today. Near syncopal episode. History of pulmonary embolism. EXAM: CT ANGIOGRAPHY CHEST WITH CONTRAST TECHNIQUE: Multidetector CT imaging of the chest was performed using the standard protocol during bolus administration of intravenous contrast. Multiplanar CT image reconstructions and MIPs were obtained to evaluate the vascular anatomy. CONTRAST:  4mL OMNIPAQUE IOHEXOL 350 MG/ML SOLN COMPARISON:  Chest CTA 03/12/2015. FINDINGS: Cardiovascular: The pulmonary arteries are well opacified with contrast to the level of the subsegmental branches. There is no evidence of acute pulmonary embolism. Atherosclerosis of the aorta, great vessels and coronary arteries without acute  vascular findings. The heart size is normal. There is no pericardial effusion. Mediastinum/Nodes: There are  no enlarged mediastinal, hilar or axillary lymph nodes.There is a stable small hiatal hernia. The trachea and thyroid gland appear unremarkable. Lungs/Pleura: There is no pleural effusion or pneumothorax. There is stable minimal scarring at both lung bases. Suspicious pulmonary nodule or confluent airspace opacity. Upper abdomen: The visualized upper abdomen appears stable without acute findings. There is stable mild biliary dilatation post cholecystectomy. Musculoskeletal/Chest wall: There is no chest wall mass or suspicious osseous finding. Stable mild chronic inferior endplate compression deformity at T4. Review of the MIP images confirms the above findings. IMPRESSION: 1. No evidence of acute pulmonary embolism or other acute chest findings. 2. Coronary and Aortic Atherosclerosis (ICD10-I70.0). 3. Stable small hiatal hernia. Electronically Signed   By: Richardean Sale M.D.   On: 08/06/2019 16:26     ASSESSMENT AND PLAN:   78 year old female with hypertension who presented to the emergency room due to generalized weakness.  1.  Generalized weakness due to hyponatremia  2.  Hyponatremia due to HCTZ Increase IV fluids to 100 mL/hr Sodium level every 6 hours Start HCTZ If sodium level without improvement in a.m. then consult Dr. Candiss Norse Case discussed briefly with Dr. Candiss Norse this morning Uric acid level also ordered this morning TSH was within normal limits  3.  Atypical chest pain due to musculoskeletal issue  4.  Essential hypertension: Continue Norvasc, losartan, metoprolol HCTZ discontinued        Management plans discussed with the patient and she is in agreement.  CODE STATUS: Full  TOTAL TIME TAKING CARE OF THIS PATIENT: 30 minutes.     POSSIBLE D/C 1 to 2 days, DEPENDING ON CLINICAL CONDITION.   Bettey Costa M.D on 08/07/2019 at 11:37 AM  Between 7am to 6pm - Pager -  [phone] After 6pm go to [email or web address] - password [email or web address] [facility]  Office  [phone]  CC: Primary care physician; Rusty Aus, MD  Note: This dictation was prepared with Dragon dictation along with smaller phrase technology. Any transcriptional errors that result from this process are unintentional.

## 2019-08-07 NOTE — Plan of Care (Signed)
  Problem: Education: Goal: Knowledge of General Education information will improve Description: Including pain rating scale, medication(s)/side effects and non-pharmacologic comfort measures Outcome: Progressing   Problem: Safety: Goal: Ability to remain free from injury will improve Outcome: Progressing   

## 2019-08-07 NOTE — Plan of Care (Signed)

## 2019-08-07 NOTE — TOC Initial Note (Signed)
Transition of Care [facility]) - Initial/Assessment Note    Patient Details  Name: Maria Manning MRN: [id number] Date of Birth: [birth year]  [facility] CM/SW Contact:    Latanya Maudlin, RN Phone Number: 08/07/2019, 8:59 AM  Clinical Narrative:   Patient admitted to [facility] under observation status for low sodium. RNCM consulted on patient to provide MOON letter and complete assessment. Patient lives at home and has full time "help" from her daughter if she needs it. She feels her weakness is directly related to her low sodium and if that is able to improve she feels she will get her strength back. Patient reports she is independent at baseline. PT is pending. She says she is likely to refuse SNF and remains unsure about home health. She wants to discuss home health closer to discharge so she can better gauge how she feels. Patient given RNCM permission to give out referral but still remains unsure if home health will ultimately be recommended by PT.CMS Medicare.gov Compare Post Acute Care list reviewed with patient and she has no preference of agency. Notified Jason at [facility] of possible need for home health. PCP is Emily Filbert. Uses [facility] pharmacy without issues.                  Expected Discharge Plan: [place] Barriers to Discharge: Continued Medical Work up   Patient Goals and CMS Choice Patient states their goals for this hospitalization and ongoing recovery are:: to get all this straightened out CMS Medicare.gov Compare Post Acute Care list provided to:: Patient Choice offered to / list presented to : Patient  Expected Discharge Plan and Services Expected Discharge Plan: [place]   Discharge Planning Services: CM Consult Post Acute Care Choice: Home Health, Durable Medical Equipment Living arrangements for the past 2 months: [place] Agency: [place] ([place]) Date [place]: 08/07/19 Time [place]: [phone] Representative spoke with at [place]: Corene Cornea  Prior Living Arrangements/Services Living arrangements for the past 2 months: [place] with:: Spouse, Adult Children Patient language and need for interpreter reviewed:: Yes Do you feel safe going back to the place where you live?: Yes      Need for Family Participation in Patient Care: No (Comment)     Criminal Activity/Legal Involvement Pertinent to Current Situation/Hospitalization: No - Comment as needed  Activities of Daily Living Home Assistive Devices/Equipment: Dentures (specify type) ADL Screening (condition at time of admission) Patient's cognitive ability adequate to safely complete daily activities?: Yes Is the patient deaf or have difficulty hearing?: No Does the patient have difficulty seeing, even when wearing glasses/contacts?: No Does the patient have difficulty concentrating, remembering, or making decisions?: No Patient able to express need for assistance with ADLs?: Yes Does the patient have difficulty dressing or bathing?: Yes Independently performs ADLs?: Yes (appropriate for developmental age) Does the patient have difficulty walking or climbing stairs?: No Weakness of Legs: None Weakness of Arms/Hands: None  Permission Sought/Granted                  Emotional Assessment Appearance:: Appears stated age Attitude/Demeanor/Rapport: Engaged Affect (typically observed): Pleasant Orientation: : Oriented to Self, Oriented to Place, Oriented to  Time, Oriented to Situation  Admission diagnosis:  Hyponatremia [E87.1] Weakness [R53.1] Atypical chest pain [R07.89] Patient Active Problem List   Diagnosis Date Noted  . Hyponatremia 08/06/2019  . Gallstones 04/21/2017  . Choledocholithiasis    PCP:  Rusty Aus, MD Pharmacy:   [facility] [address], [place] - [place] [address] Phone: [phone] Fax: [phone]     Social Determinants of Health (SDOH) Interventions    Readmission Risk Interventions No flowsheet data found.

## 2019-08-07 NOTE — Progress Notes (Addendum)
PT Cancellation Note  Patient Details Name: [name] MRN: [id number] DOB: 30-May-1941   Cancelled Treatment:    Reason Eval/Treat Not Completed: Medical issues which prohibited therapy(Most recent Na+: 126, outside range for OOB assessment and high exertion. Will evaluate at later date/time once medically appropriate.) Value only mildly improved since admission (Na+: 125).   8:43 AM, 08/07/19 Etta Grandchild, PT, DPT Physical Therapist - [facility]  [phone] ([place])    Dennis C 08/07/2019, 8:43 AM

## 2019-08-07 NOTE — Care Management Obs Status (Signed)
[place] NOTIFICATION   Patient Details  Name: ALBERTINE LAFOY MRN: [id number] Date of Birth: 1941/05/23   Medicare Observation Status Notification Given:  Yes    Josemiguel Gries A [place] Droll, RN 08/07/2019, 8:47 AM

## 2019-08-08 LAB — BASIC METABOLIC PANEL
Anion gap: 8 (ref 5–15)
BUN: 9 mg/dL (ref 8–23)
CO2: 22 mmol/L (ref 22–32)
Calcium: 8.5 mg/dL — ABNORMAL LOW (ref 8.9–10.3)
Chloride: 99 mmol/L (ref 98–111)
Creatinine, Ser: 0.53 mg/dL (ref 0.44–1.00)
GFR calc Af Amer: >60 mL/min (ref >60)
GFR calc non Af Amer: >60 mL/min (ref >60)
Glucose, Bld: 98 mg/dL (ref 70–99)
Potassium: 3.8 mmol/L (ref 3.5–5.1)
Sodium: 129 mmol/L — ABNORMAL LOW (ref 135–145)

## 2019-08-08 LAB — SODIUM: Sodium: 131 mmol/L — ABNORMAL LOW (ref 135–145)

## 2019-08-08 MED ORDER — ALPRAZOLAM 0.25 MG PO TABS
0.2500 mg | ORAL_TABLET | Freq: Every evening | ORAL | Status: DC | PRN
  Administered 2019-08-08: 0.25 mg via ORAL
  Filled 2019-08-08: qty 1

## 2019-08-08 MED ORDER — AMLODIPINE BESYLATE 10 MG PO TABS: 10.0000 mg | ORAL_TABLET | Freq: Every day | ORAL | Status: DC

## 2019-08-08 NOTE — Discharge Instructions (Addendum)
Resume diet and activity as before   Hyponatremia Hyponatremia is when the amount of salt (sodium) in your blood is too low. When salt levels are low, your body may take in extra water. This can cause swelling throughout the body. The swelling often affects the brain. What are the causes? This condition may be caused by:  Certain medical problems or conditions.  Vomiting a lot.  Having watery poop (diarrhea) often.  Certain medicines or illegal drugs.  Not having enough water in the body (dehydration).  Drinking too much water.  Eating a diet that is low in salt.  Large burns on your body.  Too much sweating. What increases the risk? You are more likely to get this condition if you:  Have long-term (chronic) kidney disease.  Have heart failure.  Have a medical condition that causes you to have watery poop often.  Do very hard exercises.  Take medicines that affect the amount of salt is in your blood. What are the signs or symptoms? Symptoms of this condition include:  Headache.  Feeling like you may vomit (nausea).  Vomiting.  Being very tired (lethargic).  Muscle weakness and cramps.  Not wanting to eat as much as normal (loss of appetite).  Feeling weak or light-headed. Severe symptoms of this condition include:  Confusion.  Feeling restless (agitation).  Having a fast heart rate.  Passing out (fainting).  Seizures.  Coma. How is this treated? Treatment for this condition depends on the cause. Treatment may include:  Getting fluids through an IV tube that is put into one of your veins.  Taking medicines to fix the salt levels in your blood. If medicines are causing the problem, your medicines will need to be changed.  Limiting how much water or fluid you take in.  Monitoring in the hospital to watch your symptoms. Follow these  instructions at home:   Take over-the-counter and prescription medicines only as told by your doctor. Many medicines can make this condition worse. Talk with your doctor about any medicines that you are taking.  Eat and drink exactly as you are told by your doctor. ? Eat only the foods you are told to eat. ? Limit how much fluid you take.  Do not drink alcohol.  Keep all follow-up visits as told by your doctor. This is important. Contact a doctor if:  You feel more like you may vomit.  You feel more tired.  Your headache gets worse.  You feel more confused.  You feel weaker.  Your symptoms go away and then they come back.  You have trouble following the diet instructions. Get help right away if:  You have a seizure.  You pass out.  You keep having watery poop.  You keep vomiting. Summary  Hyponatremia is when the amount of salt in your blood is too low.  When salt levels  are low, you can have swelling throughout the body. The swelling mostly affects the brain.  Treatment depends on the cause. Treatment may include getting IV fluids, medicines, or not drinking as much fluid. This information is not intended to replace advice given to you by your health care provider. Make sure you discuss any questions you have with your health care provider. Document Released: 08/27/2011 Document Revised: 03/03/2019 Document Reviewed: 11/18/2018 Elsevier Patient Education  2020 [employer].

## 2019-08-08 NOTE — Progress Notes (Addendum)
Pt is requesting something for sleep. Notify prime. Will continue to monitor.  Update: 0109: Dr. Jannifer Franklin state will place order. Will continue to monitor.

## 2019-08-08 NOTE — Plan of Care (Signed)

## 2019-08-08 NOTE — Plan of Care (Signed)
Pt ready for discharge home with daughter.   Problem: Education: Goal: Knowledge of General Education information will improve Description: Including pain rating scale, medication(s)/side effects and non-pharmacologic comfort measures Outcome: Completed/Met   Problem: Health Behavior/Discharge Planning: Goal: Ability to manage health-related needs will improve Outcome: Completed/Met   Problem: Clinical Measurements: Goal: Ability to maintain clinical measurements within normal limits will improve Outcome: Completed/Met Goal: Will remain free from infection Outcome: Completed/Met Goal: Diagnostic test results will improve Outcome: Completed/Met Goal: Respiratory complications will improve Outcome: Completed/Met Goal: Cardiovascular complication will be avoided Outcome: Completed/Met   Problem: Activity: Goal: Risk for activity intolerance will decrease Outcome: Completed/Met   Problem: Nutrition: Goal: Adequate nutrition will be maintained Outcome: Completed/Met   Problem: Coping: Goal: Level of anxiety will decrease Outcome: Completed/Met   Problem: Elimination: Goal: Will not experience complications related to bowel motility Outcome: Completed/Met Goal: Will not experience complications related to urinary retention Outcome: Completed/Met   Problem: Pain Managment: Goal: General experience of comfort will improve Outcome: Completed/Met   Problem: Safety: Goal: Ability to remain free from injury will improve Outcome: Completed/Met   Problem: Skin Integrity: Goal: Risk for impaired skin integrity will decrease Outcome: Completed/Met

## 2019-08-08 NOTE — Progress Notes (Signed)
PT Cancellation Note  Patient Details Name: Maria Manning MRN: [id number] DOB: 1941-05-13   Cancelled Treatment:    Reason Eval/Treat Not Completed: PT screened, no needs identified, will sign off(Pt AMB 254ft around the unit, no assistive device. 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 No acute PT needs, will sign off.)  Patient is at baseline, all education completed, and time is given to address all questions/concerns. No additional skilled PT services needed at this time, PT signing off. PT recommends daily ambulation ad lib or with nursing staff as needed to prevent deconditioning.    10:19 AM, 08/08/19 Etta Grandchild, PT, DPT Physical Therapist - [facility]  [phone] ([place])    Buccola,Allan C 08/08/2019, 10:19 AM

## 2019-08-08 NOTE — Progress Notes (Signed)
Discharge instructions provided to pt.  All questions addressed.  Understanding verified through teach back.  Awaiting transportation home via POV.  

## 2019-08-11 NOTE — Discharge Summary (Signed)
[place] [facility] [place] NAME: Maria Manning    MR#:  [id number]  DATE OF BIRTH:  09/12/1941  DATE OF ADMISSION:  08/06/2019 ADMITTING PHYSICIAN: Gladstone Lighter, MD  DATE OF DISCHARGE: 08/08/2019 12:00 PM  PRIMARY CARE PHYSICIAN: Rusty Aus, MD   ADMISSION DIAGNOSIS:  Hyponatremia [E87.1] Weakness [R53.1] Atypical chest pain [R07.89]  DISCHARGE DIAGNOSIS:  Active Problems:   Hyponatremia   SECONDARY DIAGNOSIS:   Past Medical History:  Diagnosis Date  . Elevated lipids   . GERD (gastroesophageal reflux disease)   . Hypertension   . PE (pulmonary embolism)      ADMITTING HISTORY  HISTORY OF PRESENT ILLNESS:  Maria Manning  is a 78 y.o. female with a known history of hypertension, GERD, history of PE currently not on anticoagulation, arthritis presents from home secondary to an episode of chest pain and worsening weakness this morning. Patient has  right hip arthritis and was having worsening pain in the past 4 weeks, she has seen her PCP about 3 weeks ago was given prednisone taper for 7 days, tizanidine and etodolac.  When she started taking the medications, she felt extremely nauseous, weak.  When she finished her prednisone and tizanidine her symptoms have improved her intake has improved.  This morning patient felt a twinge of chest pain nonradiating, associated with flushing and warm sensation followed by lightheadedness and near syncopal episode.  So she came to the emergency room.  Given her history of pulmonary embolism, patient had a CT angiogram done here which did not show any acute pulmonary embolism. Troponins are negative in the emergency room.  Labs indicated hyponatremia with sodium of 125.  Urine analysis is pending.  COVID-19 test is pending   HOSPITAL COURSE:   *Hyponatremia secondary to dehydration with hydrochlorothiazide.  Hydrochlorothiazide stopped.  Patient started on IV fluids and sodium improved closer  to normal.  TSH normal uric acid normal. hydrochlorothiazide discontinued at time of discharge.  *Hypertension.  Patient blood pressure medications of Norvasc, losartan and metoprolol continued.  Hydrochlorothiazide discontinued.  Follow-up with primary care physician in a week.  *Generalized weakness due to dehydration and hyponatremia has improved.  Patient ambulated prior to discharge.  Discharge home in stable condition.  CONSULTS OBTAINED:    DRUG ALLERGIES:   Allergies  Allergen Reactions  . Ace Inhibitors Cough  . Codeine Nausea Only and Nausea And Vomiting  . Lansoprazole Other (See Comments)  . Morphine And Related Nausea And Vomiting  . Penicillins   . Ranitidine   . Ranitidine Hcl Other (See Comments)    "felt weak and jittery"  . Sulfur Other (See Comments)  . Telmisartan Other (See Comments)    Adverse effects  . Penicillin V Potassium Rash    DISCHARGE MEDICATIONS:   Allergies as of 08/08/2019      Reactions   Ace Inhibitors Cough   Codeine Nausea Only, Nausea And Vomiting   Lansoprazole Other (See Comments)   Morphine And Related Nausea And Vomiting   Penicillins    Ranitidine    Ranitidine Hcl Other (See Comments)   "felt weak and jittery"   Sulfur Other (See Comments)   Telmisartan Other (See Comments)   Adverse effects   Penicillin V Potassium Rash      Medication List    STOP taking these medications   hydrochlorothiazide 25 MG tablet Commonly known as: HYDRODIURIL     TAKE these medications   acetaminophen 500 MG tablet Commonly known  as: TYLENOL Take 500-1,000 mg by mouth every 6 (six) hours as needed for mild pain, moderate pain or fever.   ALPRAZolam 0.25 MG tablet Commonly known as: XANAX Take 0.25 mg by mouth at bedtime as needed for sleep.   amLODipine 5 MG tablet Commonly known as: NORVASC Take 5 mg by mouth daily.   aspirin 81 MG tablet Take 81 mg by mouth daily.   Calcium 600-D 600-400 MG-UNIT Tabs Generic drug:  Calcium Carbonate-Vitamin D3 Take 1 tablet by mouth 2 (two) times daily.   losartan 100 MG tablet Commonly known as: COZAAR Take 100 mg by mouth daily.   metoprolol tartrate 25 MG tablet Commonly known as: LOPRESSOR Take 25 mg by mouth 2 (two) times daily.   multivitamin tablet Take 1 tablet by mouth daily.   Omeprazole 20 MG Tbec Take 20 mg by mouth daily.       Today   VITAL SIGNS:  Blood pressure (!) 163/77, pulse 72, temperature 98.2 F (36.8 C), temperature source Oral, resp. rate 18, height 5\' 4"  (1.626 m), weight 101.9 kg, SpO2 98 %.  I/O:  No intake or output data in the 24 hours ending 08/11/19 1400  PHYSICAL EXAMINATION:  Physical Exam  GENERAL:  78 y.o.-year-old patient lying in the bed with no acute distress.  LUNGS: Normal breath sounds bilaterally, no wheezing, rales,rhonchi or crepitation. No use of accessory muscles of respiration.  CARDIOVASCULAR: S1, S2 normal. No murmurs, rubs, or gallops.  ABDOMEN: Soft, non-tender, non-distended. Bowel sounds present. No organomegaly or mass.  NEUROLOGIC: Moves all 4 extremities. PSYCHIATRIC: The patient is alert and oriented x 3.  SKIN: No obvious rash, lesion, or ulcer.   DATA REVIEW:   CBC Recent Labs  Lab 08/07/19 0516  WBC 7.3  HGB 12.2  HCT 34.9*  PLT 286    Chemistries  Recent Labs  Lab 08/08/19 0130 08/08/19 0721  NA 129* 131*  K 3.8  --   CL 99  --   CO2 22  --   GLUCOSE 98  --   BUN 9  --   CREATININE 0.53  --   CALCIUM 8.5*  --     Cardiac Enzymes No results for input(s): TROPONINI in the last 168 hours.  Microbiology Results  Results for orders placed or performed during the hospital encounter of 08/06/19  SARS CORONAVIRUS 2 Nasal Swab Aptima Multi Swab     Status: None   Collection Time: 08/06/19  3:43 PM   Specimen: Aptima Multi Swab; Nasal Swab  Result Value Ref Range Status   SARS Coronavirus 2 NEGATIVE NEGATIVE Final    Comment: (NOTE) SARS-CoV-2 target nucleic acids  are NOT DETECTED. The SARS-CoV-2 RNA is generally detectable in upper and lower respiratory specimens during the acute phase of infection. Negative results do not preclude SARS-CoV-2 infection, do not rule out co-infections with other pathogens, and should not be used as the sole basis for treatment or other patient management decisions. Negative results must be combined with clinical observations, patient history, and epidemiological information. The expected result is Negative. Fact Sheet for Patients: SugarRoll.be Fact Sheet for Healthcare Providers: https://www.woods-mathews.com/ This test is not yet approved or cleared by the [place] FDA and  has been authorized for detection and/or diagnosis of SARS-CoV-2 by FDA under an Emergency Use Authorization (EUA). This EUA will remain  in effect (meaning this test can be used) for the duration of the COVID-19 declaration under Section 56 4(b)(1) of the Act, 21 U.S.C. section  360bbb-3(b)(1), unless the authorization is terminated or revoked sooner. Performed at [facility], [place] [address] [place], [place] [address]     RADIOLOGY:  No results found.  Follow up with PCP in 1 week.  Management plans discussed with the patient, family and they are in agreement.  CODE STATUS:  Code Status History    Date Active Date Inactive Code Status Order ID Comments User Context   08/06/2019 2023 08/08/2019 1531 Full Code [id number]  Gladstone Lighter, MD Inpatient   Advance Care Planning Activity      TOTAL TIME TAKING CARE OF THIS PATIENT ON DAY OF DISCHARGE: more than 30 minutes.   Maria Manning M.D on 08/11/2019 at 2:00 PM  Between 7am to 6pm - Pager - [phone]  After 6pm go to [email or web address] - password [email or web address] [facility]  Office  [phone]  CC: Primary care physician; Rusty Aus, MD  Note: This dictation was prepared with Dragon dictation along  with smaller phrase technology. Any transcriptional errors that result from this process are unintentional.

## 2019-12-06 ENCOUNTER — Other Ambulatory Visit: Payer: Self-pay | Admitting: Internal Medicine

## 2019-12-06 DIAGNOSIS — Z1231 Encounter for screening mammogram for malignant neoplasm of breast: Secondary | ICD-10-CM

## 2020-01-20 ENCOUNTER — Ambulatory Visit
Admission: RE | Admit: 2020-01-20 | Discharge: 2020-01-20 | Disposition: A | Discharge status: Home / Self Care | Payer: Medicare Other | Source: Ambulatory Visit | Attending: Internal Medicine | Admitting: Internal Medicine

## 2020-01-20 DIAGNOSIS — Z1231 Encounter for screening mammogram for malignant neoplasm of breast: Secondary | ICD-10-CM | POA: Insufficient documentation

## 2022-10-21 ENCOUNTER — Other Ambulatory Visit: Payer: Self-pay | Admitting: Internal Medicine

## 2022-10-21 ENCOUNTER — Ambulatory Visit
Admission: RE | Admit: 2022-10-21 | Discharge: 2022-10-21 | Disposition: A | Discharge status: Home / Self Care | Payer: Medicare Other | Source: Ambulatory Visit | Attending: Internal Medicine | Admitting: Internal Medicine

## 2022-10-21 DIAGNOSIS — Z1231 Encounter for screening mammogram for malignant neoplasm of breast: Secondary | ICD-10-CM | POA: Diagnosis present

## 2023-01-18 ENCOUNTER — Encounter: Payer: Self-pay | Admitting: Emergency Medicine

## 2023-01-18 ENCOUNTER — Other Ambulatory Visit: Payer: Self-pay

## 2023-01-18 ENCOUNTER — Inpatient Hospital Stay
Admission: EM | Admit: 2023-01-18 | Discharge: 2023-01-23 | DRG: 394 | Disposition: A | Discharge status: Home Health | Payer: Medicare Other | Attending: Internal Medicine | Admitting: Internal Medicine

## 2023-01-18 DIAGNOSIS — K8050 Calculus of bile duct without cholangitis or cholecystitis without obstruction: Principal | ICD-10-CM | POA: Diagnosis present

## 2023-01-18 DIAGNOSIS — K8309 Other cholangitis: Secondary | ICD-10-CM | POA: Diagnosis present

## 2023-01-18 DIAGNOSIS — Z885 Allergy status to narcotic agent status: Secondary | ICD-10-CM

## 2023-01-18 DIAGNOSIS — K219 Gastro-esophageal reflux disease without esophagitis: Secondary | ICD-10-CM | POA: Diagnosis present

## 2023-01-18 DIAGNOSIS — I10 Essential (primary) hypertension: Secondary | ICD-10-CM | POA: Diagnosis present

## 2023-01-18 DIAGNOSIS — Z86711 Personal history of pulmonary embolism: Secondary | ICD-10-CM | POA: Diagnosis present

## 2023-01-18 DIAGNOSIS — B961 Klebsiella pneumoniae [K. pneumoniae] as the cause of diseases classified elsewhere: Secondary | ICD-10-CM | POA: Diagnosis present

## 2023-01-18 DIAGNOSIS — Z803 Family history of malignant neoplasm of breast: Secondary | ICD-10-CM

## 2023-01-18 DIAGNOSIS — Z7982 Long term (current) use of aspirin: Secondary | ICD-10-CM

## 2023-01-18 DIAGNOSIS — E785 Hyperlipidemia, unspecified: Secondary | ICD-10-CM | POA: Diagnosis present

## 2023-01-18 DIAGNOSIS — K9186 Retained cholelithiasis following cholecystectomy: Secondary | ICD-10-CM | POA: Diagnosis not present

## 2023-01-18 DIAGNOSIS — R7881 Bacteremia: Secondary | ICD-10-CM | POA: Diagnosis present

## 2023-01-18 DIAGNOSIS — Z79899 Other long term (current) drug therapy: Secondary | ICD-10-CM

## 2023-01-18 DIAGNOSIS — Z87891 Personal history of nicotine dependence: Secondary | ICD-10-CM

## 2023-01-18 DIAGNOSIS — E669 Obesity, unspecified: Secondary | ICD-10-CM | POA: Diagnosis present

## 2023-01-18 DIAGNOSIS — Z9049 Acquired absence of other specified parts of digestive tract: Secondary | ICD-10-CM

## 2023-01-18 DIAGNOSIS — I4892 Unspecified atrial flutter: Secondary | ICD-10-CM | POA: Diagnosis present

## 2023-01-18 DIAGNOSIS — Z888 Allergy status to other drugs, medicaments and biological substances status: Secondary | ICD-10-CM

## 2023-01-18 DIAGNOSIS — Z880 Allergy status to penicillin: Secondary | ICD-10-CM

## 2023-01-18 DIAGNOSIS — I160 Hypertensive urgency: Secondary | ICD-10-CM | POA: Diagnosis present

## 2023-01-18 DIAGNOSIS — R1011 Right upper quadrant pain: Secondary | ICD-10-CM | POA: Diagnosis not present

## 2023-01-18 LAB — COMPREHENSIVE METABOLIC PANEL
ALT: 178 U/L — ABNORMAL HIGH (ref 0–44)
AST: 177 U/L — ABNORMAL HIGH (ref 15–41)
Albumin: 3.8 g/dL (ref 3.5–5.0)
Alkaline Phosphatase: 202 U/L — ABNORMAL HIGH (ref 38–126)
Anion gap: 12 (ref 5–15)
BUN: 11 mg/dL (ref 8–23)
CO2: 20 mmol/L — ABNORMAL LOW (ref 22–32)
Calcium: 8.8 mg/dL — ABNORMAL LOW (ref 8.9–10.3)
Chloride: 103 mmol/L (ref 98–111)
Creatinine, Ser: 0.65 mg/dL (ref 0.44–1.00)
GFR, Estimated: >60 mL/min (ref >60)
Glucose, Bld: 180 mg/dL — ABNORMAL HIGH (ref 70–99)
Potassium: 3.3 mmol/L — ABNORMAL LOW (ref 3.5–5.1)
Sodium: 135 mmol/L (ref 135–145)
Total Bilirubin: 1.3 mg/dL — ABNORMAL HIGH (ref 0.3–1.2)
Total Protein: 7.3 g/dL (ref 6.5–8.1)

## 2023-01-18 LAB — CBC
HCT: 38 % (ref 36.0–46.0)
Hemoglobin: 13 g/dL (ref 12.0–15.0)
MCH: 30.7 pg (ref 26.0–34.0)
MCHC: 34.2 g/dL (ref 30.0–36.0)
MCV: 89.6 fL (ref 80.0–100.0)
Platelets: 303 10*3/uL (ref 150–400)
RBC: 4.24 MIL/uL (ref 3.87–5.11)
RDW: 13.4 % (ref 11.5–15.5)
WBC: 10.7 10*3/uL — ABNORMAL HIGH (ref 4.0–10.5)
nRBC: 0 % (ref 0.0–0.2)

## 2023-01-18 LAB — LIPASE, BLOOD: Lipase: 55 U/L — ABNORMAL HIGH (ref 11–51)

## 2023-01-18 LAB — TROPONIN I (HIGH SENSITIVITY): Troponin I (High Sensitivity): 6 ng/L (ref <18)

## 2023-01-18 MED ORDER — ONDANSETRON HCL 4 MG/2ML IJ SOLN
4.0000 mg | Freq: Once | INTRAMUSCULAR | Status: AC
  Administered 2023-01-19: 4 mg via INTRAVENOUS
  Filled 2023-01-18: qty 2

## 2023-01-18 MED ORDER — HYDROMORPHONE HCL 1 MG/ML IJ SOLN
0.5000 mg | Freq: Once | INTRAMUSCULAR | Status: AC
  Administered 2023-01-19: 0.5 mg via INTRAVENOUS
  Filled 2023-01-18: qty 0.5

## 2023-01-18 MED ORDER — LACTATED RINGERS IV BOLUS
1000.0000 mL | Freq: Once | INTRAVENOUS | Status: AC
  Administered 2023-01-19: 1000 mL via INTRAVENOUS

## 2023-01-18 NOTE — ED Provider Notes (Signed)
[facility] Provider Note    Event Date/Time   First MD Initiated Contact with Patient 01/18/23 2320     (approximate)   History   Abdominal Pain   HPI  Maria Manning is a 82 y.o. female  past medical history of pulmonary embolism, hypertension, GERD, gallstones status postcholecystectomy and recurrent choledocholithiasis who presents with abdominal pain.  Pain started around 4 AM.  Pain is located in the right upper quadrant and radiates to the shoulder blade.  It is constant since onset.  Has had 1 episode of emesis has had several episodes of diarrhea as well.  Denies chest pain.  Denies fevers chills or urinary symptoms.  Patient has had multiple bouts of pain like this in the past.  Last was last week but self resolved and did not need to come to the hospital for it.  She has had her gallbladder removed but has had choledocholithiasis requiring ERCP in the past.  Past Medical History:  Diagnosis Date   Elevated lipids    GERD (gastroesophageal reflux disease)    Hypertension    PE (pulmonary embolism)     Patient Active Problem List   Diagnosis Date Noted   Hyponatremia 08/06/2019   Gallstones 04/21/2017   Choledocholithiasis      Physical Exam  Triage Vital Signs: ED Triage Vitals  Enc Vitals Group     BP 01/18/23 2000 (!) 143/75     Pulse Rate 01/18/23 2000 73     Resp 01/18/23 2000 (!) 26     Temp 01/18/23 2003 98.1 F (36.7 C)     Temp Source 01/18/23 2000 Oral     SpO2 01/18/23 2000 95 %     Weight 01/18/23 1958 216 lb (98 kg)     Height 01/18/23 1958 '5\' 2"'$  (1.575 m)     Head Circumference --      Peak Flow --      Pain Score 01/18/23 1958 6     Pain Loc --      Pain Edu? --      Excl. in [place]? --     Most recent vital signs: Vitals:   01/19/23 0245 01/19/23 0300  BP: (!) 198/87 (!) 185/89  Pulse: 79 77  Resp: (!) 21 (!) 21  Temp:  98.1 F (36.7 C)  SpO2: 98% 91%     General: Awake, patient appears uncomfortable  she is groaning CV:  Good peripheral perfusion.  Resp:  Normal effort.  Abd:  No distention.  Tenderness palpation the right upper quadrant right mid abdomen and right lower quadrant with voluntary guarding, abdomen is obese Neuro:             Awake, Alert, Oriented x 3  Other:     ED Results / Procedures / Treatments  Labs (all labs ordered are listed, but only abnormal results are displayed) Labs Reviewed  LIPASE, BLOOD - Abnormal; Notable for the following components:      Result Value   Lipase 55 (*)    All other components within normal limits  COMPREHENSIVE METABOLIC PANEL - Abnormal; Notable for the following components:   Potassium 3.3 (*)    CO2 20 (*)    Glucose, Bld 180 (*)    Calcium 8.8 (*)    AST 177 (*)    ALT 178 (*)    Alkaline Phosphatase 202 (*)    Total Bilirubin 1.3 (*)    All other components within normal limits  CBC - Abnormal; Notable for the following components:   WBC 10.7 (*)    All other components within normal limits  URINALYSIS, ROUTINE W REFLEX MICROSCOPIC - Abnormal; Notable for the following components:   Color, Urine YELLOW (*)    APPearance CLEAR (*)    Leukocytes,Ua MODERATE (*)    All other components within normal limits  URINE CULTURE  CULTURE, BLOOD (ROUTINE X 2)  CULTURE, BLOOD (ROUTINE X 2)  PROCALCITONIN  TROPONIN I (HIGH SENSITIVITY)  TROPONIN I (HIGH SENSITIVITY)     EKG  EKG reviewed and interpreted by myself shows what appears to be sinus rhythm but there is significant baseline artifact low voltage no obvious acute ischemic changes   RADIOLOGY Reviewed and interpreted the CT of the abdomen pelvis which shows biliary ductal dilatation   PROCEDURES:  Critical Care performed: No  Procedures  The patient is on the cardiac monitor to evaluate for evidence of arrhythmia and/or significant heart rate changes.   MEDICATIONS ORDERED IN ED: Medications  HYDROmorphone (DILAUDID) injection 0.5 mg (has no  administration in time range)  labetalol (NORMODYNE) injection 10 mg (has no administration in time range)  HYDROmorphone (DILAUDID) injection 0.5 mg (0.5 mg Intravenous Given 01/19/23 0018)  ondansetron (ZOFRAN) injection 4 mg (4 mg Intravenous Given 01/19/23 0018)  lactated ringers bolus 1,000 mL (0 mLs Intravenous Stopped 01/19/23 0253)  iohexol (OMNIPAQUE) 300 MG/ML solution 100 mL (100 mLs Intravenous Contrast Given 01/19/23 0027)  gadobutrol (GADAVIST) 1 MMOL/ML injection 10 mL (10 mLs Intravenous Contrast Given 01/19/23 0157)  HYDROmorphone (DILAUDID) injection 0.5 mg (0.5 mg Intravenous Given 01/19/23 0253)     IMPRESSION / MDM / ASSESSMENT AND PLAN / ED COURSE  I reviewed the triage vital signs and the nursing notes.                              Patient's presentation is most consistent with acute presentation with potential threat to life or bodily function.  Differential diagnosis includes, but is not limited to, choledocholithiasis, cholangitis, appendicitis, perforation  The patient is a 82 year old female presents with abdominal pain.  Started around 4 PM today is located in the right upper quadrant with some pain rating to the shoulder blade.  She has had similar episodes in the past before.  Patient has had her gallbladder removed but has had choledocholithiasis requiring ERCP and sphincterotomy in 2018.  Daughter notes that she has occasional episodes like this but there usually self resolving.  She had an episode similarly last week but did not have to come to the ER.  On exam patient looks uncomfortable she is groaning she has tenderness throughout the right abdomen but there is no rigidity or peritoneal signs.  Labs are notable for elevated AST ALT and alk phos with mild elevation of the bili.  Does have a leukocytosis of 10.7.  Lipase is near the upper limit of normal.  And concern for choledocholithiasis again but given her diffuse abdominal tenderness we will start with a CT of  the abdomen pelvis may need MRCP if there is biliary dilatation on CT.  Will give Dilaudid fluid bolus and Zofran.    CT abdomen pelvis shows dilation of the CBD.  Obtained MRCP which shows choledocholithiasis.  Patient is afebrile with just mild leukocytosis no subjective fevers at home.  Do not think clinically she has cholangitis, will send blood cultures but defer antibiotics at this time.  Discussed with  Dr. Vicente Males and Dr. Verl Blalock is available today.  Patient is NPO.  Can be admitted to [facility]. FINAL CLINICAL IMPRESSION(S) / ED DIAGNOSES   Final diagnoses:  Choledocholithiasis     Rx / DC Orders   ED Discharge Orders     None        Note:  This document was prepared using Dragon voice recognition software and may include unintentional dictation errors.   Rada Hay, MD 01/19/23 [phone]

## 2023-01-18 NOTE — ED Notes (Signed)
First Nurse Note: Pt arrives via [facility] from home with complaints of abdominal pain - rating it 7/10. Endorses relief with belching. Pt received '4mg'$  zofran & 23mg of fentanyl with EMS via [place]   VS with EMS Hx of afib - rate 80s

## 2023-01-18 NOTE — ED Notes (Signed)
Pt's daughter had came to front desk stating pt had bowl movement in pants. Pt brought back to triage rm and cleaned up by Probation officer. Pt placed in brief and bed pad placed under pt. Soiled clothes placed in belongings bag with pt.

## 2023-01-18 NOTE — ED Triage Notes (Signed)
Pt to ED via [facility] from with c/o RUQ abd pain that started tonight at approx 1700. Pt states pain is "where my gallbladder is supposed to be". Pt states has had her gallbladder removed. Per EMS '4mg'$  zofran and 62mg fentanyl given PTA.

## 2023-01-19 ENCOUNTER — Encounter: Payer: Self-pay | Admitting: Radiology

## 2023-01-19 ENCOUNTER — Emergency Department: Payer: Medicare Other

## 2023-01-19 DIAGNOSIS — Z87891 Personal history of nicotine dependence: Secondary | ICD-10-CM | POA: Diagnosis not present

## 2023-01-19 DIAGNOSIS — Z86711 Personal history of pulmonary embolism: Secondary | ICD-10-CM | POA: Diagnosis not present

## 2023-01-19 DIAGNOSIS — R7881 Bacteremia: Secondary | ICD-10-CM | POA: Diagnosis present

## 2023-01-19 DIAGNOSIS — K219 Gastro-esophageal reflux disease without esophagitis: Secondary | ICD-10-CM | POA: Diagnosis present

## 2023-01-19 DIAGNOSIS — Z7982 Long term (current) use of aspirin: Secondary | ICD-10-CM | POA: Diagnosis not present

## 2023-01-19 DIAGNOSIS — K8309 Other cholangitis: Secondary | ICD-10-CM

## 2023-01-19 DIAGNOSIS — Z885 Allergy status to narcotic agent status: Secondary | ICD-10-CM | POA: Diagnosis not present

## 2023-01-19 DIAGNOSIS — I10 Essential (primary) hypertension: Secondary | ICD-10-CM | POA: Insufficient documentation

## 2023-01-19 DIAGNOSIS — I160 Hypertensive urgency: Secondary | ICD-10-CM | POA: Diagnosis present

## 2023-01-19 DIAGNOSIS — E669 Obesity, unspecified: Secondary | ICD-10-CM | POA: Diagnosis present

## 2023-01-19 DIAGNOSIS — I4892 Unspecified atrial flutter: Secondary | ICD-10-CM | POA: Diagnosis present

## 2023-01-19 DIAGNOSIS — K8050 Calculus of bile duct without cholangitis or cholecystitis without obstruction: Secondary | ICD-10-CM | POA: Diagnosis not present

## 2023-01-19 DIAGNOSIS — K9186 Retained cholelithiasis following cholecystectomy: Secondary | ICD-10-CM | POA: Diagnosis present

## 2023-01-19 DIAGNOSIS — R1011 Right upper quadrant pain: Secondary | ICD-10-CM | POA: Diagnosis present

## 2023-01-19 DIAGNOSIS — Z79899 Other long term (current) drug therapy: Secondary | ICD-10-CM | POA: Diagnosis not present

## 2023-01-19 DIAGNOSIS — E785 Hyperlipidemia, unspecified: Secondary | ICD-10-CM | POA: Diagnosis present

## 2023-01-19 DIAGNOSIS — B961 Klebsiella pneumoniae [K. pneumoniae] as the cause of diseases classified elsewhere: Secondary | ICD-10-CM | POA: Diagnosis present

## 2023-01-19 DIAGNOSIS — Z888 Allergy status to other drugs, medicaments and biological substances status: Secondary | ICD-10-CM | POA: Diagnosis not present

## 2023-01-19 DIAGNOSIS — Z880 Allergy status to penicillin: Secondary | ICD-10-CM | POA: Diagnosis not present

## 2023-01-19 DIAGNOSIS — Z803 Family history of malignant neoplasm of breast: Secondary | ICD-10-CM | POA: Diagnosis not present

## 2023-01-19 DIAGNOSIS — Z9049 Acquired absence of other specified parts of digestive tract: Secondary | ICD-10-CM | POA: Diagnosis not present

## 2023-01-19 LAB — BLOOD CULTURE ID PANEL (REFLEXED) - BCID2

## 2023-01-19 LAB — URINALYSIS, ROUTINE W REFLEX MICROSCOPIC
Bacteria, UA: NONE SEEN
Bilirubin Urine: NEGATIVE
Glucose, UA: NEGATIVE mg/dL
Hgb urine dipstick: NEGATIVE
Ketones, ur: NEGATIVE mg/dL
Nitrite: NEGATIVE
Protein, ur: NEGATIVE mg/dL
Specific Gravity, Urine: 1.012 (ref 1.005–1.030)
pH: 5 (ref 5.0–8.0)

## 2023-01-19 LAB — TROPONIN I (HIGH SENSITIVITY): Troponin I (High Sensitivity): 9 ng/L (ref <18)

## 2023-01-19 LAB — PROCALCITONIN: Procalcitonin: 2.27 ng/mL

## 2023-01-19 MED ORDER — GADOBUTROL 1 MMOL/ML IV SOLN
10.0000 mL | Freq: Once | INTRAVENOUS | Status: AC | PRN
  Administered 2023-01-19: 10 mL via INTRAVENOUS

## 2023-01-19 MED ORDER — ONDANSETRON HCL 4 MG PO TABS: 4.0000 mg | ORAL_TABLET | Freq: Four times a day (QID) | ORAL | Status: DC | PRN

## 2023-01-19 MED ORDER — HYDROMORPHONE HCL 1 MG/ML IJ SOLN
0.5000 mg | Freq: Once | INTRAMUSCULAR | Status: AC
  Administered 2023-01-19: 0.5 mg via INTRAVENOUS
  Filled 2023-01-19: qty 0.5

## 2023-01-19 MED ORDER — ACETAMINOPHEN 325 MG PO TABS
650.0000 mg | ORAL_TABLET | Freq: Four times a day (QID) | ORAL | Status: DC | PRN
  Administered 2023-01-20 – 2023-01-22 (×4): 650 mg via ORAL
  Filled 2023-01-19 (×5): qty 2

## 2023-01-19 MED ORDER — METRONIDAZOLE 500 MG/100ML IV SOLN
500.0000 mg | Freq: Two times a day (BID) | INTRAVENOUS | Status: DC
  Administered 2023-01-19 – 2023-01-23 (×8): 500 mg via INTRAVENOUS
  Filled 2023-01-19 (×10): qty 100

## 2023-01-19 MED ORDER — ONDANSETRON HCL 4 MG/2ML IJ SOLN
4.0000 mg | Freq: Four times a day (QID) | INTRAMUSCULAR | Status: DC | PRN
  Administered 2023-01-19 – 2023-01-20 (×3): 4 mg via INTRAVENOUS
  Filled 2023-01-19 (×3): qty 2

## 2023-01-19 MED ORDER — HYDROMORPHONE HCL 1 MG/ML IJ SOLN
0.5000 mg | INTRAMUSCULAR | Status: DC | PRN
  Administered 2023-01-19 – 2023-01-23 (×19): 0.5 mg via INTRAVENOUS
  Filled 2023-01-19 (×19): qty 0.5

## 2023-01-19 MED ORDER — IOHEXOL 300 MG/ML  SOLN
100.0000 mL | Freq: Once | INTRAMUSCULAR | Status: AC | PRN
  Administered 2023-01-19: 100 mL via INTRAVENOUS

## 2023-01-19 MED ORDER — PANTOPRAZOLE SODIUM 40 MG PO TBEC
40.0000 mg | DELAYED_RELEASE_TABLET | Freq: Every day | ORAL | Status: DC
  Administered 2023-01-21 – 2023-01-23 (×3): 40 mg via ORAL
  Filled 2023-01-19 (×3): qty 1

## 2023-01-19 MED ORDER — LACTATED RINGERS IV SOLN: INTRAVENOUS | Status: DC

## 2023-01-19 MED ORDER — SODIUM CHLORIDE 0.9 % IV SOLN
2.0000 g | INTRAVENOUS | Status: DC
  Administered 2023-01-19 – 2023-01-21 (×3): 2 g via INTRAVENOUS
  Filled 2023-01-19 (×3): qty 20

## 2023-01-19 MED ORDER — MORPHINE SULFATE (PF) 2 MG/ML IV SOLN: 2.0000 mg | INTRAVENOUS | Status: DC | PRN

## 2023-01-19 MED ORDER — LABETALOL HCL 5 MG/ML IV SOLN
10.0000 mg | Freq: Once | INTRAVENOUS | Status: AC
  Administered 2023-01-19: 10 mg via INTRAVENOUS
  Filled 2023-01-19: qty 4

## 2023-01-19 MED ORDER — METOPROLOL TARTRATE 5 MG/5ML IV SOLN
5.0000 mg | Freq: Four times a day (QID) | INTRAVENOUS | Status: DC
  Administered 2023-01-19 – 2023-01-22 (×13): 5 mg via INTRAVENOUS
  Filled 2023-01-19 (×13): qty 5

## 2023-01-19 MED ORDER — AMLODIPINE BESYLATE 5 MG PO TABS
5.0000 mg | ORAL_TABLET | Freq: Every day | ORAL | Status: DC
  Administered 2023-01-19 – 2023-01-22 (×3): 5 mg via ORAL
  Filled 2023-01-19 (×3): qty 1

## 2023-01-19 MED ORDER — ACETAMINOPHEN 650 MG RE SUPP: 650.0000 mg | Freq: Four times a day (QID) | RECTAL | Status: DC | PRN

## 2023-01-19 NOTE — ED Notes (Signed)
Pt urine has been dark in color throughout duration of shift and turned a slightly darker tea color MD Amin notified.

## 2023-01-19 NOTE — Assessment & Plan Note (Signed)
Not currently on anticoagulation

## 2023-01-19 NOTE — Hospital Course (Addendum)
Taken from H&P.  Maria Manning is a 82 y.o. 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  Most of the history is given by her daughter at the bedside who states that patient started having intermittent postprandial pain over the last few weeks but on the night of arrival it was very severe and she asked them to call EMS.   ED course and data review: Afebrile, tachypneic to 26 with otherwise unremarkable vitals.  WBC 10,700.  AST 177, ALT 178, alk phos 2021 total bilirubin 1.3.  Lipase 55.EKG, personally viewed and interpreted showing atrial flutter at 68 with no concerning ST-T wave changes. MRCP showing a 14 mm calculus within the distal common duct at the ampulla among other findings as outlined below: IMPRESSION: 1. Moderate intra and extrahepatic biliary ductal dilation secondary to a 14 mm calculus within the distal common duct at the ampulla. 2. Several simple appearing cysts seen scattered within the head and uncinate process of the pancreas as well as the distal body of the pancreas which demonstrate no significant restricted diffusion or enhancement and are most in keeping with multiple small dilated pancreatic side branches. 3. A single 8 mm cyst within the neck of the pancreas demonstrates restricted diffusion without associated abnormal enhancement. 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 No aggressive features are identified though the examination is limited by motion artifact. This could be further assessed with EUS/FNA if clinically indicated. 4. Small hiatal hernia.  GI was consulted for  ERCP.  1/22: Vital stable.  Worsening leukocytosis and procalcitonin at 2.27.  Starting her on ceftriaxone and Flagyl for concern of intra-abdominal infection.  Going for ERCP tomorrow morning. UA with moderate leukocytosis-cultures pending.  No urinary symptoms  1/23: Vitals with mildly elevated blood pressure at 157/74.  Labs with worsening hepatic function, worsening leukocytosis.  Blood cultures with Klebsiella pneumonia.  Urine cultures with insignificant growth. Patient had successful ERCP with sphincterotomy and sweeping of bile stone.  Tolerated the procedure well.  1/24: Hemodynamically stable with mildly elevated blood pressure at 167/75.  Patient continued to have significant pain.  Labs with improving hepatic function.  Slight worsening of hyponatremia to 131.  CBC with some improvement of leukocytosis to 19.2, hemoglobin at 11.5.  Slight worsening of procalcitonin to 5.  Blood cultures with second organism called Aeromonas along with Klebsiella pneumonia.  Repeating blood cultures.  1/25: Vitals with elevated blood pressure.  Restarting home losartan and metoprolol.  Procalcitonin and leukocytosis improving.  Repeat blood cultures remain negative in 24-hour. Klebsiella pneumonia susceptibilities are still pending.  Most likely she will be discharged  on ciprofloxacin to complete a total of 2 weeks of antibiotics.  1/26: Hemodynamically stable.  Procalcitonin continued to trend down.  Repeat blood cultures remain negative in 2 days.  Able to tolerate diet with improvement in pain. Prior blood culture with 3 different bacteria, she is being discharged on ciprofloxacin and Flagyl for 7 more days to complete a 10-day course after discussing with infectious disease.  She was also given Norco to use only as needed for severe pain if Tylenol does not help.  She will continue the rest of her home medications and need  to have a close follow-up with gastroenterologist and primary care provider  for further recommendations.

## 2023-01-19 NOTE — ED Notes (Signed)
Pt second set of antibiotics started. Pt A&Ox4. Daughter still at bedside.

## 2023-01-19 NOTE — Assessment & Plan Note (Addendum)
History of cholecystectomy. Procalcitonin elevated at 2.27 and patient continued to have significant abdominal pain MRCP showing stone at the ampulla. Starting her on ceftriaxone and Flagyl for concern of intra-abdominal infection. N.p.o. Pain control IV fluids, IV antiemetics GI was consulted for ERCP

## 2023-01-19 NOTE — H&P (Addendum)
History and Physical    Patient: Maria Manning OVZ:[id number] DOB: June 18, 1941 DOA: 01/18/2023 DOS: the patient was seen and examined on 01/19/2023 PCP: Rusty Aus, MD  Patient coming from: Home  Chief Complaint:  Chief Complaint  Patient presents with   Abdominal Pain    HPI: Maria Manning is a 82 y.o. 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  Most of the history is given by her daughter at the bedside who states that patient started having intermittent postprandial pain over the last few weeks but on the night of arrival it was very severe and she asked them to call EMS.  The pain has been constant since the onset and was associated with an episode of emesis and several episodes of diarrhea.  She has had no fevers or chills. ED course and data review: Afebrile, tachypneic to 26 with otherwise unremarkable vitals.  WBC 10,700.  AST 177, ALT 178, alk phos 2021 total bilirubin 1.3.  Lipase 55.EKG, personally viewed and interpreted showing atrial flutter at 68 with no concerning ST-T wave changes. MRCP showing a 14 mm calculus within the distal common duct at the ampulla among other findings as outlined below: IMPRESSION: 1. Moderate intra and extrahepatic biliary ductal dilation secondary to a 14 mm calculus within the distal common duct at the ampulla. 2. Several simple appearing cysts seen scattered within the head and uncinate process of the pancreas as well as the distal body of the pancreas which demonstrate no significant restricted diffusion or enhancement and are most in keeping with multiple small dilated pancreatic side branches. 3. A single 8 mm cyst within the neck of the  pancreas demonstrates restricted diffusion without associated abnormal enhancement. 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 No aggressive features are identified though the examination is limited by motion artifact. This could be further assessed with EUS/FNA if clinically indicated. 4. Small hiatal hernia.  The ED provider spoke with gastroenterologist, Dr. Vicente Males who confirmed that there will be availability for ERCP services.  Hospitalist consulted for admission     Past Medical History:  Diagnosis Date   Elevated lipids    GERD (gastroesophageal reflux disease)    Hypertension    PE (pulmonary embolism)    Past Surgical History:  Procedure Laterality Date   ABDOMINAL HYSTERECTOMY     1993 1970's   CHOLECYSTECTOMY     COLONOSCOPY WITH PROPOFOL N/A 07/09/2016   Procedure: COLONOSCOPY WITH PROPOFOL;  Surgeon: Manya Silvas, MD;  Location: [facility];  Service: Endoscopy;  Laterality: N/A;   ENDOSCOPIC RETROGRADE CHOLANGIOPANCREATOGRAPHY (ERCP) WITH PROPOFOL N/A 04/22/2017   Procedure: ENDOSCOPIC RETROGRADE CHOLANGIOPANCREATOGRAPHY (ERCP) WITH PROPOFOL;  Surgeon: Lucilla Lame, MD;  Location: [facility];  Service: Endoscopy;  Laterality: N/A;   EYE SURGERY     cataract od   Social History:  reports that she has quit smoking. She has never used smokeless tobacco. She reports that she does not drink alcohol and does not use drugs.  Allergies  Allergen Reactions   Ace Inhibitors Cough   Codeine Nausea Only and Nausea And Vomiting   Elemental Sulfur Other (See Comments)   Lansoprazole Other (See Comments)   Morphine And  Related Nausea And Vomiting   Penicillins    Ranitidine    Ranitidine Hcl Other (See Comments)    "felt weak and jittery"   Telmisartan Other (See Comments)    Adverse effects   Penicillin V Potassium Rash    Family History  Problem Relation Age of Onset   Breast  cancer Maternal Aunt 32   Breast cancer Mother 39    Prior to Admission medications   Medication Sig Start Date End Date Taking? Authorizing Provider  acetaminophen (TYLENOL) 500 MG tablet Take 500-1,000 mg by mouth every 6 (six) hours as needed for mild pain, moderate pain or fever.    [provider]  ALPRAZolam Duanne Moron) 0.25 MG tablet Take 0.25 mg by mouth at bedtime as needed for sleep. 07/06/19   [provider]  amLODipine (NORVASC) 5 MG tablet Take 5 mg by mouth daily.    [provider]  aspirin 81 MG tablet Take 81 mg by mouth daily.    [provider]  Calcium Carbonate-Vitamin D3 (CALCIUM 600-D) 600-400 MG-UNIT TABS Take 1 tablet by mouth 2 (two) times daily.     [provider]  losartan (COZAAR) 100 MG tablet Take 100 mg by mouth daily.    [provider]  metoprolol tartrate (LOPRESSOR) 25 MG tablet Take 25 mg by mouth 2 (two) times daily.    [provider]  Multiple Vitamin (MULTIVITAMIN) tablet Take 1 tablet by mouth daily.    [provider]  Omeprazole 20 MG TBEC Take 20 mg by mouth daily.     [provider]    Physical Exam: Vitals:   01/18/23 2330 01/19/23 0000 01/19/23 0245 01/19/23 0300  BP: (!) 166/107 (!) 196/84 (!) 198/87 (!) 185/89  Pulse: 75 79 79 77  Resp: 19 19 (!) 21 (!) 21  Temp:    98.1 F (36.7 C)  TempSrc:      SpO2: 100% 100% 98% 91%  Weight:      Height:       Physical Exam Vitals and nursing note reviewed.  Constitutional:      General: She is not in acute distress.    Appearance: She is morbidly obese.     Comments: Patient in discomfort from pain  HENT:     Head: Normocephalic and atraumatic.  Cardiovascular:     Rate and Rhythm: Normal rate. Rhythm irregular.     Heart sounds: Normal heart sounds.  Pulmonary:     Effort: Pulmonary effort is normal.     Breath sounds: Normal breath sounds.  Abdominal:     Palpations: Abdomen is soft.     Tenderness:  There is no abdominal tenderness.  Neurological:     Mental Status: Mental status is at baseline.     Labs on Admission: I have personally reviewed following labs and imaging studies  CBC: Recent Labs  Lab 01/18/23 2002  WBC 10.7*  HGB 13.0  HCT 38.0  MCV 89.6  PLT 956   Basic Metabolic Panel: Recent Labs  Lab 01/18/23 2002  NA 135  K 3.3*  CL 103  CO2 20*  GLUCOSE 180*  BUN 11  CREATININE 0.65  CALCIUM 8.8*   GFR: Estimated Creatinine Clearance: 61.4 mL/min (by C-G formula based on SCr of 0.65 mg/dL). Liver Function Tests: Recent Labs  Lab 01/18/23 2002  AST 177*  ALT 178*  ALKPHOS 202*  BILITOT 1.3*  PROT 7.3  ALBUMIN 3.8   Recent Labs  Lab 01/18/23 2002  LIPASE 55*   No results for input(s): "AMMONIA" in the last 168 hours. Coagulation Profile: No results for input(s): "INR", "PROTIME" in the last 168 hours. Cardiac Enzymes: No results for input(s): "CKTOTAL", "CKMB", "CKMBINDEX", "TROPONINI" in the last 168 hours. BNP (last 3 results) No results for input(s): "PROBNP" in the last 8760 hours. HbA1C: No results for input(s): "HGBA1C" in the last 72 hours. CBG: No results for input(s): "GLUCAP" in the last 168 hours. Lipid Profile: No results for input(s): "CHOL", "HDL", "LDLCALC", "TRIG", "CHOLHDL", "LDLDIRECT" in the last 72 hours. Thyroid Function Tests: No results for input(s): "TSH", "T4TOTAL", "FREET4", "T3FREE", "THYROIDAB" in the last 72 hours. Anemia Panel: No results for input(s): "VITAMINB12", "FOLATE", "FERRITIN", "TIBC", "IRON", "RETICCTPCT" in the last 72 hours. Urine analysis:    Component Value Date/Time   COLORURINE YELLOW (A) 01/19/2023 0024   APPEARANCEUR CLEAR (A) 01/19/2023 0024   LABSPEC 1.012 01/19/2023 0024   PHURINE 5.0 01/19/2023 0024   GLUCOSEU NEGATIVE 01/19/2023 0024   HGBUR NEGATIVE 01/19/2023 0024   BILIRUBINUR NEGATIVE 01/19/2023 0024   KETONESUR NEGATIVE 01/19/2023 0024   PROTEINUR NEGATIVE 01/19/2023 0024    NITRITE NEGATIVE 01/19/2023 0024   LEUKOCYTESUR MODERATE (A) 01/19/2023 0024    Radiological Exams on Admission: MR ABDOMEN MRCP W WO CONTAST  Result Date: 01/19/2023 CLINICAL DATA:  Right upper quadrant abdominal pain EXAM: MRI ABDOMEN WITHOUT AND WITH CONTRAST (INCLUDING MRCP) TECHNIQUE: Multiplanar multisequence MR imaging of the abdomen was performed both before and after the administration of intravenous contrast. Heavily T2-weighted images of the biliary and pancreatic ducts were obtained, and three-dimensional MRCP images were rendered by post processing. CONTRAST:  54m GADAVIST GADOBUTROL 1 MMOL/ML IV SOLN COMPARISON:  CT 01/19/2023 FINDINGS: Lower chest: No acute findings.  Small hiatal hernia. Hepatobiliary: No focal intrahepatic mass identified. Normal hepatic parenchymal signal intensity. Moderate intra and extrahepatic biliary ductal dilation with the extrahepatic bile duct measuring up to 16 mm in diameter. There is a intraluminal filling defect compatible with a calculus measuring up to 14 mm in diameter within the distal common duct at the ampulla. Status post cholecystectomy. Pancreas: No enhancing intrapancreatic mass identified. No pancreatic ductal dilation. No peripancreatic inflammatory changes seen. Several simple cysts are seen scattered within the head and uncinate process of the pancreas as well as the distal body of the pancreas which demonstrate no significant restricted diffusion or enhancement and are most in keeping with multiple small dilated pancreatic side branches. A single 8 mm cyst is identified, however, within the neck of the pancreas at image # 19/9 and demonstrates restricted diffusion, best appreciated on image # 19/10. 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 No associated abnormal enhancement. Spleen:  Within normal limits in size and appearance. Adrenals/Urinary Tract: The  adrenal glands are unremarkable. Simple cortical cysts are seen within the kidneys bilaterally. No follow-up imaging is recommended. Mild bilateral nonspecific perinephric stranding. The kidneys are otherwise unremarkable. No hydronephrosis. Stomach/Bowel: Visualized portions within the abdomen are unremarkable. Vascular/Lymphatic: No pathologically enlarged lymph nodes identified. No abdominal aortic aneurysm demonstrated. Other:  None. Musculoskeletal: No suspicious bone lesions identified. IMPRESSION: 1. Moderate intra and extrahepatic biliary ductal dilation secondary to a 14 mm calculus within the distal common duct at the ampulla. 2. Several simple appearing cysts seen scattered within the head and uncinate process of the pancreas as well as the distal body of the pancreas which demonstrate no significant restricted diffusion or enhancement and are most  in keeping with multiple small dilated pancreatic side branches. 3. A single 8 mm cyst within the neck of the pancreas demonstrates restricted diffusion without associated abnormal enhancement. 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 No aggressive features are identified though the examination is limited by motion artifact. This could be further assessed with EUS/FNA if clinically indicated. 4. Small hiatal hernia. Electronically Signed   By: Fidela Salisbury M.D.   On: 01/19/2023 04:28   MR 3D Recon At Scanner  Result Date: 01/19/2023 CLINICAL DATA:  Right upper quadrant abdominal pain EXAM: MRI ABDOMEN WITHOUT AND WITH CONTRAST (INCLUDING MRCP) TECHNIQUE: Multiplanar multisequence MR imaging of the abdomen was performed both before and after the administration of intravenous contrast. Heavily T2-weighted images of the biliary and pancreatic ducts were obtained, and three-dimensional MRCP images were rendered by post processing. CONTRAST:  52m GADAVIST GADOBUTROL 1 MMOL/ML  IV SOLN COMPARISON:  CT 01/19/2023 FINDINGS: Lower chest: No acute findings.  Small hiatal hernia. Hepatobiliary: No focal intrahepatic mass identified. Normal hepatic parenchymal signal intensity. Moderate intra and extrahepatic biliary ductal dilation with the extrahepatic bile duct measuring up to 16 mm in diameter. There is a intraluminal filling defect compatible with a calculus measuring up to 14 mm in diameter within the distal common duct at the ampulla. Status post cholecystectomy. Pancreas: No enhancing intrapancreatic mass identified. No pancreatic ductal dilation. No peripancreatic inflammatory changes seen. Several simple cysts are seen scattered within the head and uncinate process of the pancreas as well as the distal body of the pancreas which demonstrate no significant restricted diffusion or enhancement and are most in keeping with multiple small dilated pancreatic side branches. A single 8 mm cyst is identified, however, within the neck of the pancreas at image # 19/9 and demonstrates restricted diffusion, best appreciated on image # 19/10. 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 No associated abnormal enhancement. Spleen:  Within normal limits in size and appearance. Adrenals/Urinary Tract: The adrenal glands are unremarkable. Simple cortical cysts are seen within the kidneys bilaterally. No follow-up imaging is recommended. Mild bilateral nonspecific perinephric stranding. The kidneys are otherwise unremarkable. No hydronephrosis. Stomach/Bowel: Visualized portions within the abdomen are unremarkable. Vascular/Lymphatic: No pathologically enlarged lymph nodes identified. No abdominal aortic aneurysm demonstrated. Other:  None. Musculoskeletal: No suspicious bone lesions identified. IMPRESSION: 1. Moderate intra and extrahepatic biliary ductal dilation secondary to a 14 mm calculus within the distal common duct at  the ampulla. 2. Several simple appearing cysts seen scattered within the head and uncinate process of the pancreas as well as the distal body of the pancreas which demonstrate no significant restricted diffusion or enhancement and are most in keeping with multiple small dilated pancreatic side branches. 3. A single 8 mm cyst within the neck of the pancreas demonstrates restricted diffusion without associated abnormal enhancement. 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 No aggressive features are identified though the examination is limited by motion artifact. This could be further assessed with EUS/FNA if clinically indicated. 4. Small hiatal hernia. Electronically Signed   By: AFidela SalisburyM.D.   On: 01/19/2023 04:28   CT ABDOMEN PELVIS W CONTRAST  Result Date: 01/19/2023 CLINICAL DATA:  Right lower quadrant abdominal pain. EXAM: CT ABDOMEN AND PELVIS WITH CONTRAST TECHNIQUE: Multidetector CT imaging of the abdomen and pelvis was performed using the standard  protocol following bolus administration of intravenous contrast. RADIATION DOSE REDUCTION: This exam was performed according to the departmental dose-optimization program which includes automated exposure control, adjustment of the mA and/or kV according to patient size and/or use of iterative reconstruction technique. CONTRAST:  120m OMNIPAQUE IOHEXOL 300 MG/ML  SOLN COMPARISON:  CT of the abdomen pelvis dated 10/23/2016. FINDINGS: Lower chest: Left lung base linear atelectasis/scarring. The visualized lung bases are otherwise clear. There is coronary vascular calcification. No intra-abdominal free air or free fluid. Hepatobiliary: The liver is unremarkable. Cholecystectomy. There is dilatation of the intrahepatic and extrahepatic biliary tree. The common bile duct measures approximately 19 mm in diameter. A 13 mm nodular density in the central CBD at the head of the  pancreas (coronal 59/603) suspicious for a stone. Further evaluation with MRCP is recommended. Pancreas: Several small cystic pancreatic lesions measuring up to 11 mm in the uncinate process of the pancreas. 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 Attention on MRI recommended. No active inflammatory changes. Spleen: Normal in size without focal abnormality. Adrenals/Urinary Tract: The adrenal glands are unremarkable. There is a 12 mm left renal inferior pole cyst. There is no hydronephrosis on either side. There is symmetric enhancement and excretion of contrast by both kidneys. The visualized ureters and urinary bladder appear unremarkable. Stomach/Bowel: There is a small hiatal hernia. There is no bowel obstruction or active inflammation. The appendix is not visualized with certainty. No inflammatory changes identified in the right lower quadrant. Vascular/Lymphatic: Moderate aortoiliac atherosclerotic disease. The IVC is unremarkable. No portal venous gas. There is no adenopathy. Reproductive: Hysterectomy.  No adnexal masses. Other: None Musculoskeletal: Osteopenia with degenerative changes of the spine. No acute osseous pathology. IMPRESSION: 1. No bowel obstruction. 2. Dilatation of the biliary tree likely related to obstruction of the central CBD at the head of the pancreas. Further evaluation with MRCP is recommended. 3.  Aortic Atherosclerosis (ICD10-I70.0). Electronically Signed   By: AAnner CreteM.D.   On: 01/19/2023 00:54     Data Reviewed: Relevant notes from primary care and specialist visits, past discharge summaries as available in EHR, including Care Everywhere. Prior diagnostic testing as pertinent to current admission diagnoses Updated medications and problem lists for reconciliation ED course, including vitals, labs, imaging, treatment and response to treatment Triage notes, nursing and pharmacy notes and ED provider's notes Notable results  as noted in HPI   Assessment and Plan: * Choledocholithiasis, recurrent History of cholecystectomy MRCP showing stone at the ampulla N.p.o. Pain control IV fluids, IV antiemetics GI consult for ERCP  Atrial flutter ([place] Appears new onset on EKG with no prior history of same IV metoprolol for rate control Cardiology consult  Hypertensive urgency SBP in the 180s, suspect related to pain Pain control Metoprolol 5 mg IV every 6 while n.p.o.  History of pulmonary embolism Not currently on anticoagulation        DVT prophylaxis: SCD  Consults: GI, Dr. AVicente Males cardiology, Dr. GRockey Situ Advance Care Planning:   Code Status: Prior   Family Communication: Daughter at bedside  Disposition Plan: Back to previous home environment  Severity of Illness: The appropriate patient status for this patient is INPATIENT. Inpatient status is judged to be reasonable and necessary in order to provide the required intensity of service to ensure the patient's safety. The patient's presenting symptoms, physical exam findings, and initial radiographic and laboratory data in the context of their chronic comorbidities is felt to place them at  high risk for further clinical deterioration. Furthermore, it is not anticipated that the patient will be medically stable for discharge from the hospital within 2 midnights of admission.   * I certify that at the point of admission it is my clinical judgment that the patient will require inpatient hospital care spanning beyond 2 midnights from the point of admission due to high intensity of service, high risk for further deterioration and high frequency of surveillance required.*  Author: Athena Masse, MD 01/19/2023 5:33 AM  For on call review [email or web address].

## 2023-01-19 NOTE — ED Notes (Signed)
RN in room to check on pt. Pt and daughter who is at bedside both sleeping. Pt does not appear to be in any distress at this time.

## 2023-01-19 NOTE — ED Notes (Signed)
Pt resting with equal chest rise and fall RR. Sister at bedside.

## 2023-01-19 NOTE — Assessment & Plan Note (Addendum)
Resolved. Appears new onset on EKG with no prior history of same. Cardiology saw the monitor and EKG and does not think that it is atrial flutter or fibrillation.  This signed off stating that we can reconsult if needed

## 2023-01-19 NOTE — Assessment & Plan Note (Addendum)
History of cholecystectomy. Procalcitonin elevated at 2.27 and patient continued to have significant abdominal pain MRCP showing stone at the ampulla. Starting her on ceftriaxone and Flagyl for concern of intra-abdominal infection. S/p ERCP with sphincterotomy and removal of bile stone. -Continue with pain management -Continue with antibiotics -Continue with supportive care -Advance diet as tolerated

## 2023-01-19 NOTE — Progress Notes (Signed)
PHARMACY - PHYSICIAN COMMUNICATION CRITICAL VALUE ALERT - BLOOD CULTURE IDENTIFICATION (BCID)  Maria Manning is an 82 y.o. female who presented to [facility] on 01/18/2023 with a chief complaint of Choledocholithiasis  Assessment:  All 4 bottles pos for Klebsiella pneumoniae (ESBL not detected)  Name of physician (or Provider) Contacted: N/A  Current antibiotics: ceftriaxone, metronidazole  Changes to prescribed antibiotics recommended:  None, ceftriaxone 1st line therapy  Results for orders placed or performed during the hospital encounter of 01/18/23  Blood Culture ID Panel (Reflexed) (Collected: 01/19/2023  5:42 AM)  Result Value Ref Range   Enterococcus faecalis NOT DETECTED NOT DETECTED   Enterococcus Faecium NOT DETECTED NOT DETECTED   Listeria monocytogenes NOT DETECTED NOT DETECTED   Staphylococcus species NOT DETECTED NOT DETECTED   Staphylococcus aureus (BCID) NOT DETECTED NOT DETECTED   Staphylococcus epidermidis NOT DETECTED NOT DETECTED   Staphylococcus lugdunensis NOT DETECTED NOT DETECTED   Streptococcus species NOT DETECTED NOT DETECTED   Streptococcus agalactiae NOT DETECTED NOT DETECTED   Streptococcus pneumoniae NOT DETECTED NOT DETECTED   Streptococcus pyogenes NOT DETECTED NOT DETECTED   A.calcoaceticus-baumannii NOT DETECTED NOT DETECTED   Bacteroides fragilis NOT DETECTED NOT DETECTED   Enterobacterales DETECTED (A) NOT DETECTED   Enterobacter cloacae complex NOT DETECTED NOT DETECTED   Escherichia coli NOT DETECTED NOT DETECTED   Klebsiella aerogenes NOT DETECTED NOT DETECTED   Klebsiella oxytoca NOT DETECTED NOT DETECTED   Klebsiella pneumoniae DETECTED (A) NOT DETECTED   Proteus species NOT DETECTED NOT DETECTED   Salmonella species NOT DETECTED NOT DETECTED   Serratia marcescens NOT DETECTED NOT DETECTED   Haemophilus influenzae NOT DETECTED NOT DETECTED   Neisseria meningitidis NOT DETECTED NOT DETECTED   Pseudomonas aeruginosa NOT DETECTED NOT  DETECTED   Stenotrophomonas maltophilia NOT DETECTED NOT DETECTED   Candida albicans NOT DETECTED NOT DETECTED   Candida auris NOT DETECTED NOT DETECTED   Candida glabrata NOT DETECTED NOT DETECTED   Candida krusei NOT DETECTED NOT DETECTED   Candida parapsilosis NOT DETECTED NOT DETECTED   Candida tropicalis NOT DETECTED NOT DETECTED   Cryptococcus neoformans/gattii NOT DETECTED NOT DETECTED   CTX-M ESBL NOT DETECTED NOT DETECTED   Carbapenem resistance IMP NOT DETECTED NOT DETECTED   Carbapenem resistance KPC NOT DETECTED NOT DETECTED   Carbapenem resistance NDM NOT DETECTED NOT DETECTED   Carbapenem resist OXA 48 LIKE NOT DETECTED NOT DETECTED   Carbapenem resistance VIM NOT DETECTED NOT DETECTED    Alison Murray 01/19/2023  6:23 PM

## 2023-01-19 NOTE — Progress Notes (Addendum)
Progress Note   Patient: Maria Manning OQH:[id number] DOB: 08/20/1941 DOA: 01/18/2023     0 DOS: the patient was seen and examined on 01/19/2023   Brief hospital course: Taken from H&P.  Maria Manning is a 82 y.o. 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  Most of the history is given by her daughter at the bedside who states that patient started having intermittent postprandial pain over the last few weeks but on the night of arrival it was very severe and she asked them to call EMS.   ED course and data review: Afebrile, tachypneic to 26 with otherwise unremarkable vitals.  WBC 10,700.  AST 177, ALT 178, alk phos 2021 total bilirubin 1.3.  Lipase 55.EKG, personally viewed and interpreted showing atrial flutter at 68 with no concerning ST-T wave changes. MRCP showing a 14 mm calculus within the distal common duct at the ampulla among other findings as outlined below: IMPRESSION: 1. Moderate intra and extrahepatic biliary ductal dilation secondary to a 14 mm calculus within the distal common duct at the ampulla. 2. Several simple appearing cysts seen scattered within the head and uncinate process of the pancreas as well as the distal body of the pancreas which demonstrate no significant restricted diffusion or enhancement and are most in keeping with multiple small dilated pancreatic side branches. 3. A single 8 mm cyst within the neck of the pancreas demonstrates restricted diffusion without associated abnormal enhancement. 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 No aggressive features are identified though  the examination is limited by motion artifact. This could be further assessed with EUS/FNA if clinically indicated. 4. Small hiatal hernia.  GI was consulted for ERCP.  1/22: Vital stable.  Worsening leukocytosis and procalcitonin at 2.27.  Starting her on ceftriaxone and Flagyl for concern of intra-abdominal infection.  Going for ERCP tomorrow morning. UA with moderate leukocytosis-cultures pending.  No urinary symptoms   Assessment and Plan: * Choledocholithiasis, recurrent History of cholecystectomy. Procalcitonin elevated at 2.27 and patient continued to have significant abdominal pain MRCP showing stone at the ampulla. Starting her on ceftriaxone and Flagyl for concern of intra-abdominal infection. Clear liquid and an ARB p.o. after midnight for ERCP tomorrow. Pain control IV fluids, IV antiemetics GI was consulted for ERCP  Atrial flutter (HCC) Resolved. Appears new onset on EKG with no prior history of same. Cardiology saw the monitor and EKG and does not think that it is atrial flutter or fibrillation.  This signed off stating that we can reconsult if needed  Hypertensive urgency Blood pressure still elevated., suspect related to pain Pain control Restarting home amlodipine Metoprolol 5 mg IV every 6 while n.p.o.  History of pulmonary embolism Not currently on anticoagulation    Subjective: Patient continued to have abdominal pain, mostly involving epigastrium and RUQ.  Denies any urinary symptoms.  Physical Exam: Vitals:   01/19/23 1136 01/19/23 1136 01/19/23 1200 01/19/23 1300  BP:   (!) 143/76 (!) 171/78  Pulse:   88 90  Resp:   (!) 26 (!) 22  Temp:  98.3 F (36.8 C)    TempSrc:  Oral    SpO2: 93%  92% 92%  Weight:  Height:       General.  Obese elderly lady, in no acute distress. Pulmonary.  Lungs clear bilaterally, normal respiratory effort. CV.  Regular rate and rhythm, no JVD, rub or murmur. Abdomen.  Soft, nontender, nondistended, BS  positive. CNS.  Alert and oriented .  No focal neurologic deficit. Extremities.  No edema, no cyanosis, pulses intact and symmetrical. Psychiatry.  Judgment and insight appears normal.   Data Reviewed: Prior data reviewed  Family Communication: Discussed with daughter at bedside  Disposition: Status is: Inpatient Remains inpatient appropriate because: Severity of illness  Planned Discharge Destination: Home   Time spent:  minutes  This record has been created using Systems analyst. Errors have been sought and corrected,but may not always be located. Such creation errors do not reflect on the standard of care.   Author: Lorella Nimrod, MD 01/19/2023 2:10 PM  For on call review [email or web address].

## 2023-01-19 NOTE — ED Notes (Signed)
Pt A&Ox4. Pt daughter at bedside.

## 2023-01-19 NOTE — Assessment & Plan Note (Addendum)
Blood pressure still elevated., suspect related to pain Pain control Restarting home amlodipine Metoprolol 5 mg IV every 6 while n.p.o.

## 2023-01-19 NOTE — Consult Note (Signed)
Maria Darby, MD [address]  [place]  [place],  [address]  Main: [phone]  Fax: [phone] Pager: [phone]   Consultation  Referring Provider:     No ref. provider found Primary Care Physician:  Rusty Aus, MD Primary Gastroenterologist: Althia Forts       Reason for Consultation: Choledocholithiasis  Date of Admission:  01/18/2023 Date of Consultation:  01/19/2023         HPI:   Maria Manning is a 82 y.o. female with obesity, hypertension, hyperlipidemia, history of cholecystectomy, recurrent choledocholithiasis s/p biliary sphincterotomy and stone extraction presented with acute onset of right upper quadrant pain.  Both patient's daughter and her sister were at bedside who provided most of the history.  Patient had a dinner yesterday, started experiencing right upper quadrant pain around 5 PM radiating to her back, associated with nausea, vomiting and several episodes of diarrhea.  Patient's daughter stated that her mom has been experiencing intermittent episodes of postprandial right upper quadrant discomfort associated with nausea has been ongoing for several months, at least once a month.  This has been the worst pain which brought her to the ER.  She denies any fever, chills.  Patient was hemodynamically stable in the ER, labs revealed WBC count 10K, AST 177, ALT 178, alkaline phosphatase 202, T. bili 1.3, serum lipase 55.  Blood cultures also came back positive for Klebsiella pneumoniae and Enterobacterales.  Patient is started on ceftriaxone and metronidazole.  Patient reports right upper quadrant discomfort  Patient had choledocholithiasis in 2018, ERCP with repeat biliary sphincterotomy and stone extraction by Dr. Allen Norris  NSAIDs: None  Antiplts/Anticoagulants/Anti thrombotics: None  GI Procedures:  ERCP 04/22/2017 Prior biliary sphincterotomy was open Choledocholithiasis was found.  Complete removal was accomplished with biliary  sphincterotomy and balloon extraction Biliary tree sweep was performed  Past Medical History:  Diagnosis Date   Elevated lipids    GERD (gastroesophageal reflux disease)    Hypertension    PE (pulmonary embolism)     Past Surgical History:  Procedure Laterality Date   ABDOMINAL HYSTERECTOMY     1993 1970's   CHOLECYSTECTOMY     COLONOSCOPY WITH PROPOFOL N/A 07/09/2016   Procedure: COLONOSCOPY WITH PROPOFOL;  Surgeon: Manya Silvas, MD;  Location: [facility] ENDOSCOPY;  Service: Endoscopy;  Laterality: N/A;   ENDOSCOPIC RETROGRADE CHOLANGIOPANCREATOGRAPHY (ERCP) WITH PROPOFOL N/A 04/22/2017   Procedure: ENDOSCOPIC RETROGRADE CHOLANGIOPANCREATOGRAPHY (ERCP) WITH PROPOFOL;  Surgeon: Lucilla Lame, MD;  Location: [facility] ENDOSCOPY;  Service: Endoscopy;  Laterality: N/A;   EYE SURGERY     cataract od     Current Facility-Administered Medications:    acetaminophen (TYLENOL) tablet 650 mg, 650 mg, Oral, Q6H PRN **OR** acetaminophen (TYLENOL) suppository 650 mg, 650 mg, Rectal, Q6H PRN, Athena Masse, MD   amLODipine (NORVASC) tablet 5 mg, 5 mg, Oral, Daily, Amin, Sumayya, MD, 5 mg at 01/19/23 1452   cefTRIAXone (ROCEPHIN) 2 g in sodium chloride 0.9 % 100 mL IVPB, 2 g, Intravenous, Q24H, Lorin Picket, RPH, Stopped at 01/19/23 1248   HYDROmorphone (DILAUDID) injection 0.5 mg, 0.5 mg, Intravenous, Q4H PRN, Lorella Nimrod, MD, 0.5 mg at 01/19/23 2011   lactated ringers infusion, , Intravenous, Continuous, Judd Gaudier V, MD, Last Rate: 125 mL/hr at 01/19/23 1458, New Bag at 01/19/23 1458   metoprolol tartrate (LOPRESSOR) injection 5 mg, 5 mg, Intravenous, Q6H, Athena Masse, MD, 5 mg at 01/19/23 1813   metroNIDAZOLE (FLAGYL) IVPB 500 mg, 500 mg, Intravenous,  Fredonia Highland, RPH, Stopped at 01/19/23 1400   ondansetron (ZOFRAN) tablet 4 mg, 4 mg, Oral, Q6H PRN **OR** ondansetron (ZOFRAN) injection 4 mg, 4 mg, Intravenous, Q6H PRN, Athena Masse, MD   [START ON 01/20/2023]  pantoprazole (PROTONIX) EC tablet 40 mg, 40 mg, Oral, Daily, Lorella Nimrod, MD   Family History  Problem Relation Age of Onset   Breast cancer Maternal Aunt 65   Breast cancer Mother 61     Social History   Tobacco Use   Smoking status: Former   Smokeless tobacco: Never  Substance Use Topics   Alcohol use: No   Drug use: No    Allergies as of 01/18/2023 - Review Complete 01/18/2023  Allergen Reaction Noted   Ace inhibitors Cough 07/27/2014   Codeine Nausea Only and Nausea And Vomiting 07/27/2014   Elemental sulfur Other (See Comments) 05/30/2016   Lansoprazole Other (See Comments) 07/27/2014   Morphine and related Nausea And Vomiting 04/21/2017   Penicillins  07/08/2016   Ranitidine  07/08/2016   Ranitidine hcl Other (See Comments) 07/27/2014   Telmisartan Other (See Comments) 07/27/2014   Penicillin v potassium Rash 07/27/2014    Review of Systems:    All systems reviewed and negative except where noted in HPI.   Physical Exam:  Vital signs in last 24 hours: Temp:  [98 F (36.7 C)-99.3 F (37.4 C)] 98 F (36.7 C) (01/22 2100) Pulse Rate:  [75-131] 85 (01/22 2100) Resp:  [19-42] 22 (01/22 2100) BP: (115-214)/(66-118) 140/68 (01/22 2100) SpO2:  [89 %-100 %] 90 % (01/22 2100) Last BM Date : 01/18/23 General:   Pleasant, cooperative in NAD Head:  Normocephalic and atraumatic. Eyes:   No icterus.   Conjunctiva pink. PERRLA. Ears:  Normal auditory acuity. Neck:  Supple; no masses or thyroidomegaly Lungs: Respirations even and unlabored. Lungs clear to auscultation bilaterally.   No wheezes, crackles, or rhonchi.  Heart:  Regular rate and rhythm;  Without murmur, clicks, rubs or gallops Abdomen:  Soft, nondistended, right upper quadrant tenderness. Normal bowel sounds. No appreciable masses or hepatomegaly.  No rebound or guarding.  Rectal:  Not performed. Msk:  Symmetrical without gross deformities.  Strength generalized weakness Extremities:  Without edema,  cyanosis or clubbing. Neurologic:  Alert and oriented x3;  grossly normal neurologically. Skin:  Intact without significant lesions or rashes. Cervical Nodes:  No significant cervical adenopathy. Psych:  Alert and cooperative. Normal affect.  LAB RESULTS:    Latest Ref Rng & Units 01/18/2023    8:02 PM 08/07/2019    5:16 AM 08/06/2019    1:16 PM  CBC  WBC 4.0 - 10.5 K/uL 10.7  7.3  10.2   Hemoglobin 12.0 - 15.0 g/dL 13.0  12.2  14.6   Hematocrit 36.0 - 46.0 % 38.0  34.9  41.9   Platelets 150 - 400 K/uL 303  286  333     BMET    Latest Ref Rng & Units 01/18/2023    8:02 PM 08/08/2019    7:21 AM 08/08/2019    1:30 AM  BMP  Glucose 70 - 99 mg/dL 180   98   BUN 8 - 23 mg/dL 11   9   Creatinine 0.44 - 1.00 mg/dL 0.65   0.53   Sodium 135 - 145 mmol/L 135  131  129   Potassium 3.5 - 5.1 mmol/L 3.3   3.8   Chloride 98 - 111 mmol/L 103   99   CO2 22 - 32  mmol/L 20   22   Calcium 8.9 - 10.3 mg/dL 8.8   8.5     LFT    Latest Ref Rng & Units 01/18/2023    8:02 PM 04/22/2017    4:33 AM  Hepatic Function  Total Protein 6.5 - 8.1 g/dL 7.3  6.1   Albumin 3.5 - 5.0 g/dL 3.8  3.3   AST 15 - 41 U/L 177  22   ALT 0 - 44 U/L 178  30   Alk Phosphatase 38 - 126 U/L 202  113   Total Bilirubin 0.3 - 1.2 mg/dL 1.3  0.7      STUDIES: MR ABDOMEN MRCP W WO CONTAST  Result Date: 01/19/2023 CLINICAL DATA:  Right upper quadrant abdominal pain EXAM: MRI ABDOMEN WITHOUT AND WITH CONTRAST (INCLUDING MRCP) TECHNIQUE: Multiplanar multisequence MR imaging of the abdomen was performed both before and after the administration of intravenous contrast. Heavily T2-weighted images of the biliary and pancreatic ducts were obtained, and three-dimensional MRCP images were rendered by post processing. CONTRAST:  30m GADAVIST GADOBUTROL 1 MMOL/ML IV SOLN COMPARISON:  CT 01/19/2023 FINDINGS: Lower chest: No acute findings.  Small hiatal hernia. Hepatobiliary: No focal intrahepatic mass identified. Normal hepatic  parenchymal signal intensity. Moderate intra and extrahepatic biliary ductal dilation with the extrahepatic bile duct measuring up to 16 mm in diameter. There is a intraluminal filling defect compatible with a calculus measuring up to 14 mm in diameter within the distal common duct at the ampulla. Status post cholecystectomy. Pancreas: No enhancing intrapancreatic mass identified. No pancreatic ductal dilation. No peripancreatic inflammatory changes seen. Several simple cysts are seen scattered within the head and uncinate process of the pancreas as well as the distal body of the pancreas which demonstrate no significant restricted diffusion or enhancement and are most in keeping with multiple small dilated pancreatic side branches. A single 8 mm cyst is identified, however, within the neck of the pancreas at image # 19/9 and demonstrates restricted diffusion, best appreciated on image # 19/10. 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 No associated abnormal enhancement. Spleen:  Within normal limits in size and appearance. Adrenals/Urinary Tract: The adrenal glands are unremarkable. Simple cortical cysts are seen within the kidneys bilaterally. No follow-up imaging is recommended. Mild bilateral nonspecific perinephric stranding. The kidneys are otherwise unremarkable. No hydronephrosis. Stomach/Bowel: Visualized portions within the abdomen are unremarkable. Vascular/Lymphatic: No pathologically enlarged lymph nodes identified. No abdominal aortic aneurysm demonstrated. Other:  None. Musculoskeletal: No suspicious bone lesions identified. IMPRESSION: 1. Moderate intra and extrahepatic biliary ductal dilation secondary to a 14 mm calculus within the distal common duct at the ampulla. 2. Several simple appearing cysts seen scattered within the head and uncinate process of the pancreas as well as the distal body of the pancreas which  demonstrate no significant restricted diffusion or enhancement and are most in keeping with multiple small dilated pancreatic side branches. 3. A single 8 mm cyst within the neck of the pancreas demonstrates restricted diffusion without associated abnormal enhancement. 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 No aggressive features are identified though the examination is limited by motion artifact. This could be further assessed with EUS/FNA if clinically indicated. 4. Small hiatal hernia. Electronically Signed   By: AFidela SalisburyM.D.   On: 01/19/2023 04:28   MR 3D Recon At Scanner  Result Date: 01/19/2023 CLINICAL DATA:  Right  upper quadrant abdominal pain EXAM: MRI ABDOMEN WITHOUT AND WITH CONTRAST (INCLUDING MRCP) TECHNIQUE: Multiplanar multisequence MR imaging of the abdomen was performed both before and after the administration of intravenous contrast. Heavily T2-weighted images of the biliary and pancreatic ducts were obtained, and three-dimensional MRCP images were rendered by post processing. CONTRAST:  57m GADAVIST GADOBUTROL 1 MMOL/ML IV SOLN COMPARISON:  CT 01/19/2023 FINDINGS: Lower chest: No acute findings.  Small hiatal hernia. Hepatobiliary: No focal intrahepatic mass identified. Normal hepatic parenchymal signal intensity. Moderate intra and extrahepatic biliary ductal dilation with the extrahepatic bile duct measuring up to 16 mm in diameter. There is a intraluminal filling defect compatible with a calculus measuring up to 14 mm in diameter within the distal common duct at the ampulla. Status post cholecystectomy. Pancreas: No enhancing intrapancreatic mass identified. No pancreatic ductal dilation. No peripancreatic inflammatory changes seen. Several simple cysts are seen scattered within the head and uncinate process of the pancreas as well as the distal body of the pancreas which demonstrate no significant  restricted diffusion or enhancement and are most in keeping with multiple small dilated pancreatic side branches. A single 8 mm cyst is identified, however, within the neck of the pancreas at image # 19/9 and demonstrates restricted diffusion, best appreciated on image # 19/10. 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 No associated abnormal enhancement. Spleen:  Within normal limits in size and appearance. Adrenals/Urinary Tract: The adrenal glands are unremarkable. Simple cortical cysts are seen within the kidneys bilaterally. No follow-up imaging is recommended. Mild bilateral nonspecific perinephric stranding. The kidneys are otherwise unremarkable. No hydronephrosis. Stomach/Bowel: Visualized portions within the abdomen are unremarkable. Vascular/Lymphatic: No pathologically enlarged lymph nodes identified. No abdominal aortic aneurysm demonstrated. Other:  None. Musculoskeletal: No suspicious bone lesions identified. IMPRESSION: 1. Moderate intra and extrahepatic biliary ductal dilation secondary to a 14 mm calculus within the distal common duct at the ampulla. 2. Several simple appearing cysts seen scattered within the head and uncinate process of the pancreas as well as the distal body of the pancreas which demonstrate no significant restricted diffusion or enhancement and are most in keeping with multiple small dilated pancreatic side branches. 3. A single 8 mm cyst within the neck of the pancreas demonstrates restricted diffusion without associated abnormal enhancement. 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 No aggressive features are identified though the examination is limited by motion artifact. This could be further assessed with EUS/FNA if clinically indicated. 4. Small hiatal hernia. Electronically Signed   By: AFidela SalisburyM.D.   On:  01/19/2023 04:28   CT ABDOMEN PELVIS W CONTRAST  Result Date: 01/19/2023 CLINICAL DATA:  Right lower quadrant abdominal pain. EXAM: CT ABDOMEN AND PELVIS WITH CONTRAST TECHNIQUE: Multidetector CT imaging of the abdomen and pelvis was performed using the standard protocol following bolus administration of intravenous contrast. RADIATION DOSE REDUCTION: This exam was performed according to the departmental dose-optimization program which includes automated exposure control, adjustment of the mA and/or kV according to patient size and/or use of iterative reconstruction technique. CONTRAST:  1062mOMNIPAQUE IOHEXOL 300 MG/ML  SOLN COMPARISON:  CT of the abdomen pelvis dated 10/23/2016. FINDINGS: Lower chest: Left lung base linear atelectasis/scarring. The visualized lung bases are otherwise clear. There is coronary vascular calcification. No intra-abdominal free air or free fluid. Hepatobiliary: The liver is unremarkable. Cholecystectomy. There is dilatation of the intrahepatic and extrahepatic biliary  tree. The common bile duct measures approximately 19 mm in diameter. A 13 mm nodular density in the central CBD at the head of the pancreas (coronal 59/603) suspicious for a stone. Further evaluation with MRCP is recommended. Pancreas: Several small cystic pancreatic lesions measuring up to 11 mm in the uncinate process of the pancreas. 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 Attention on MRI recommended. No active inflammatory changes. Spleen: Normal in size without focal abnormality. Adrenals/Urinary Tract: The adrenal glands are unremarkable. There is a 12 mm left renal inferior pole cyst. There is no hydronephrosis on either side. There is symmetric enhancement and excretion of contrast by both kidneys. The visualized ureters and urinary bladder appear unremarkable. Stomach/Bowel: There is a small hiatal hernia. There is no bowel obstruction or active inflammation. The  appendix is not visualized with certainty. No inflammatory changes identified in the right lower quadrant. Vascular/Lymphatic: Moderate aortoiliac atherosclerotic disease. The IVC is unremarkable. No portal venous gas. There is no adenopathy. Reproductive: Hysterectomy.  No adnexal masses. Other: None Musculoskeletal: Osteopenia with degenerative changes of the spine. No acute osseous pathology. IMPRESSION: 1. No bowel obstruction. 2. Dilatation of the biliary tree likely related to obstruction of the central CBD at the head of the pancreas. Further evaluation with MRCP is recommended. 3.  Aortic Atherosclerosis (ICD10-I70.0). Electronically Signed   By: Anner Crete M.D.   On: 01/19/2023 00:54      Impression / Plan:   Maria Manning is a 82 y.o. Caucasian female with history of cholelithiasis, s/p cholecystectomy, recurrent choledocholithiasis s/p ERCP with biliary sphincterotomy and stone extraction, most recently in 2018, hypertension, hyperlipidemia, obesity is admitted with recurrent choledocholithiasis and gram-negative bacteremia concerning for ascending cholangitis  Recurrent choledocholithiasis with gram-negative bacteremia concerning for ascending cholangitis No evidence of gallstone pancreatitis Patient is currently hemodynamically stable Initiated on ceftriaxone and metronidazole Okay to start clear liquid diet N.p.o. effective midnight Continue maintenance IV fluids Discussed with patient and her family regarding repeat ERCP tomorrow and they are agreeable Dr. Allen Norris will be performing the ERCP  Thank you for involving me in the care of this patient.     LOS: 0 days   Sherri Sear, MD  01/19/2023, 9:57 PM    Note: This dictation was prepared with Dragon dictation along with smaller phrase technology. Any transcriptional errors that result from this process are unintentional.

## 2023-01-20 ENCOUNTER — Inpatient Hospital Stay: Payer: Medicare Other | Admitting: Certified Registered"

## 2023-01-20 ENCOUNTER — Inpatient Hospital Stay: Payer: Medicare Other

## 2023-01-20 ENCOUNTER — Encounter
Admission: EM | Disposition: A | Discharge status: Home Health | Payer: Self-pay | Source: Home / Self Care | Attending: Internal Medicine

## 2023-01-20 ENCOUNTER — Encounter: Payer: Self-pay | Admitting: Internal Medicine

## 2023-01-20 DIAGNOSIS — Z9049 Acquired absence of other specified parts of digestive tract: Secondary | ICD-10-CM | POA: Diagnosis not present

## 2023-01-20 DIAGNOSIS — R7881 Bacteremia: Secondary | ICD-10-CM | POA: Diagnosis not present

## 2023-01-20 DIAGNOSIS — I160 Hypertensive urgency: Secondary | ICD-10-CM

## 2023-01-20 DIAGNOSIS — Z86711 Personal history of pulmonary embolism: Secondary | ICD-10-CM

## 2023-01-20 DIAGNOSIS — K8050 Calculus of bile duct without cholangitis or cholecystitis without obstruction: Secondary | ICD-10-CM | POA: Diagnosis not present

## 2023-01-20 DIAGNOSIS — K8309 Other cholangitis: Secondary | ICD-10-CM | POA: Diagnosis not present

## 2023-01-20 HISTORY — PX: ERCP: SHX5425

## 2023-01-20 LAB — CBC
HCT: 37.2 % (ref 36.0–46.0)
Hemoglobin: 12.7 g/dL (ref 12.0–15.0)
MCH: 30.5 pg (ref 26.0–34.0)
MCHC: 34.1 g/dL (ref 30.0–36.0)
MCV: 89.4 fL (ref 80.0–100.0)
Platelets: 216 10*3/uL (ref 150–400)
RBC: 4.16 MIL/uL (ref 3.87–5.11)
RDW: 14.2 % (ref 11.5–15.5)
WBC: 25.8 10*3/uL — ABNORMAL HIGH (ref 4.0–10.5)
nRBC: 0 % (ref 0.0–0.2)

## 2023-01-20 LAB — COMPREHENSIVE METABOLIC PANEL
ALT: 405 U/L — ABNORMAL HIGH (ref 0–44)
AST: 273 U/L — ABNORMAL HIGH (ref 15–41)
Albumin: 3 g/dL — ABNORMAL LOW (ref 3.5–5.0)
Alkaline Phosphatase: 185 U/L — ABNORMAL HIGH (ref 38–126)
Anion gap: 12 (ref 5–15)
BUN: 19 mg/dL (ref 8–23)
CO2: 20 mmol/L — ABNORMAL LOW (ref 22–32)
Calcium: 7.8 mg/dL — ABNORMAL LOW (ref 8.9–10.3)
Chloride: 100 mmol/L (ref 98–111)
Creatinine, Ser: 0.72 mg/dL (ref 0.44–1.00)
GFR, Estimated: >60 mL/min (ref >60)
Glucose, Bld: 111 mg/dL — ABNORMAL HIGH (ref 70–99)
Potassium: 3.7 mmol/L (ref 3.5–5.1)
Sodium: 132 mmol/L — ABNORMAL LOW (ref 135–145)
Total Bilirubin: 2.7 mg/dL — ABNORMAL HIGH (ref 0.3–1.2)
Total Protein: 6.3 g/dL — ABNORMAL LOW (ref 6.5–8.1)

## 2023-01-20 LAB — URINE CULTURE: Culture: <10000 — AB

## 2023-01-20 SURGERY — ERCP, WITH INTERVENTION IF INDICATED
Anesthesia: General

## 2023-01-20 MED ORDER — DICLOFENAC SUPPOSITORY 100 MG
100.0000 mg | Freq: Once | RECTAL | Status: DC
  Filled 2023-01-20: qty 1

## 2023-01-20 MED ORDER — LIDOCAINE HCL (CARDIAC) PF 100 MG/5ML IV SOSY
PREFILLED_SYRINGE | INTRAVENOUS | Status: DC | PRN
  Administered 2023-01-20: 100 mg via INTRAVENOUS

## 2023-01-20 MED ORDER — EPHEDRINE SULFATE (PRESSORS) 50 MG/ML IJ SOLN
INTRAMUSCULAR | Status: DC | PRN
  Administered 2023-01-20: 20 mg via INTRAVENOUS

## 2023-01-20 MED ORDER — FENTANYL CITRATE (PF) 100 MCG/2ML IJ SOLN
INTRAMUSCULAR | Status: DC | PRN
  Administered 2023-01-20: 25 ug via INTRAVENOUS

## 2023-01-20 MED ORDER — PROPOFOL 10 MG/ML IV BOLUS
INTRAVENOUS | Status: AC
  Filled 2023-01-20: qty 20

## 2023-01-20 MED ORDER — SODIUM CHLORIDE 0.9 % IV SOLN: INTRAVENOUS | Status: DC

## 2023-01-20 MED ORDER — DEXMEDETOMIDINE HCL IN NACL 80 MCG/20ML IV SOLN
INTRAVENOUS | Status: DC | PRN
  Administered 2023-01-20: 12 ug via BUCCAL

## 2023-01-20 MED ORDER — ONDANSETRON HCL 4 MG/2ML IJ SOLN
4.0000 mg | Freq: Four times a day (QID) | INTRAMUSCULAR | Status: DC | PRN
  Administered 2023-01-20 – 2023-01-23 (×8): 4 mg via INTRAVENOUS
  Filled 2023-01-20 (×8): qty 2

## 2023-01-20 MED ORDER — PHENYLEPHRINE 80 MCG/ML (10ML) SYRINGE FOR IV PUSH (FOR BLOOD PRESSURE SUPPORT)
PREFILLED_SYRINGE | INTRAVENOUS | Status: DC | PRN
  Administered 2023-01-20 (×2): 80 ug via INTRAVENOUS
  Administered 2023-01-20: 40 ug via INTRAVENOUS
  Administered 2023-01-20: 80 ug via INTRAVENOUS

## 2023-01-20 MED ORDER — PROPOFOL 500 MG/50ML IV EMUL
INTRAVENOUS | Status: DC | PRN
  Administered 2023-01-20: 150 ug/kg/min via INTRAVENOUS

## 2023-01-20 MED ORDER — PROPOFOL 10 MG/ML IV BOLUS
INTRAVENOUS | Status: DC | PRN
  Administered 2023-01-20: 50 mg via INTRAVENOUS

## 2023-01-20 MED ORDER — FENTANYL CITRATE (PF) 100 MCG/2ML IJ SOLN
INTRAMUSCULAR | Status: AC
  Filled 2023-01-20: qty 2

## 2023-01-20 MED ORDER — ONDANSETRON HCL 4 MG PO TABS
4.0000 mg | ORAL_TABLET | ORAL | Status: DC | PRN
  Administered 2023-01-20: 4 mg via ORAL
  Filled 2023-01-20: qty 1

## 2023-01-20 MED ORDER — DICLOFENAC SUPPOSITORY 100 MG
RECTAL | Status: AC
  Filled 2023-01-20: qty 1

## 2023-01-20 NOTE — Transfer of Care (Signed)
Immediate Anesthesia Transfer of Care Note  Patient: Maria Manning  Procedure(s) Performed: ENDOSCOPIC RETROGRADE CHOLANGIOPANCREATOGRAPHY (ERCP)  Patient Location: PACU and Endoscopy Unit  Anesthesia Type:General  Level of Consciousness: awake  Airway & Oxygen Therapy: Patient Spontanous Breathing and Patient connected to nasal cannula oxygen  Post-op Assessment: Report given to RN and Post -op Vital signs reviewed and stable  Post vital signs: Reviewed and stable  Last Vitals:  Vitals Value Taken Time  BP 129/79 01/20/23 1131  Temp 36.7 C 01/20/23 1131  Pulse 111 01/20/23 1134  Resp 10 01/20/23 1134  SpO2 97 % 01/20/23 1134  Vitals shown include unvalidated device data.  Last Pain:  Vitals:   01/20/23 1131  TempSrc: Temporal  PainSc: 5          Complications:  C/o pain. Fentanyl given with relief

## 2023-01-20 NOTE — Assessment & Plan Note (Signed)
Resolved. Appears new onset on EKG with no prior history of same. Cardiology saw the monitor and EKG and does not think that it is atrial flutter or fibrillation.  They signed off stating that we can reconsult if needed

## 2023-01-20 NOTE — Op Note (Signed)
[facility] Gastroenterology Patient Name: Maria Manning Procedure Date: 01/20/2023 10:54 AM MRN: [id number] Account #: [id number] Date of Birth: 27-Feb-1941 Admit Type: Inpatient Age: 82 Room: [facility] ENDO ROOM 4 Gender: Female Note Status: Finalized Instrument Name: EXALT Ercp scope Procedure:             ERCP Indications:           Bile duct stone(s) Providers:             Lucilla Lame MD, MD Referring MD:          Rusty Aus, MD (Referring MD) Medicines:             Propofol per Anesthesia Complications:         No immediate complications. Procedure:             Pre-Anesthesia Assessment:                        - Prior to the procedure, a History and Physical was                         performed, and patient medications and allergies were                         reviewed. The patient's tolerance of previous                         anesthesia was also reviewed. The risks and benefits                         of the procedure and the sedation options and risks                         were discussed with the patient. All questions were                         answered, and informed consent was obtained. Prior                         Anticoagulants: The patient has taken no anticoagulant                         or antiplatelet agents. ASA Grade Assessment: II - A                         patient with mild systemic disease. After reviewing                         the risks and benefits, the patient was deemed in                         satisfactory condition to undergo the procedure.                        After obtaining informed consent, the scope was passed                         under direct vision. Throughout the procedure, the  patient's blood pressure, pulse, and oxygen                         saturations were monitored continuously. The [employer] D single use duodenoscope was                          introduced through the mouth, and used to inject                         contrast into and used to inject contrast into the                         bile duct. The ERCP was accomplished without                         difficulty. The patient tolerated the procedure well. Findings:      A scout film of the abdomen was obtained. Surgical clips, consistent       with a previous cholecystectomy, were seen in the area of the right       upper quadrant of the abdomen. The esophagus was successfully intubated       under direct vision. The scope was advanced to a normal major papilla in       the descending duodenum without detailed examination of the pharynx,       larynx and associated structures, and upper GI tract. The upper GI tract       was grossly normal. The bile duct was deeply cannulated with the       short-nosed traction sphincterotome. Contrast was injected. I personally       interpreted the bile duct images. There was brisk flow of contrast       through the ducts. Image quality was excellent. Contrast extended to the       entire biliary tree. The lower third of the main bile duct contained       multiple stones. A wire was passed into the biliary tree. The biliary       sphincterotomy was extended with a traction (standard) sphincterotome       using ERBE electrocautery. There was no post-sphincterotomy bleeding. To       discover objects, the biliary tree was swept with a 15 mm balloon       starting at the bifurcation. Sludge was swept from the duct. All stones       were removed. Nothing was found. Impression:            - Choledocholithiasis was found. Complete removal was                         accomplished by biliary sphincterotomy and balloon                         extraction.                        - A biliary sphincterotomy was performed.                        -  The biliary tree was swept and nothing was found. Recommendation:        - Return patient to  hospital ward for ongoing care.                        - Resume previous diet. Procedure Code(s):     --- Professional ---                        [phone], Endoscopic retrograde cholangiopancreatography                         (ERCP); with removal of calculi/debris from                         biliary/pancreatic duct(s)                        43262, Endoscopic retrograde cholangiopancreatography                         (ERCP); with sphincterotomy/papillotomy                        [phone], Endoscopic catheterization of the biliary                         ductal system, radiological supervision and                         interpretation Diagnosis Code(s):     --- Professional ---                        K80.50, Calculus of bile duct without cholangitis or                         cholecystitis without obstruction CPT copyright 2022 American [employer] Association. All rights reserved. The codes documented in this report are preliminary and upon coder review may  be revised to meet current compliance requirements. Lucilla Lame MD, MD 01/20/2023 11:29:31 AM This report has been signed electronically. Number of Addenda: 0 Note Initiated On: 01/20/2023 10:54 AM Estimated Blood Loss:  Estimated blood loss: none.      [facility]

## 2023-01-20 NOTE — Anesthesia Postprocedure Evaluation (Signed)
Anesthesia Post Note  Patient: Maria Manning  Procedure(s) Performed: ENDOSCOPIC RETROGRADE CHOLANGIOPANCREATOGRAPHY (ERCP)  Patient location during evaluation: Endoscopy Anesthesia Type: General Level of consciousness: awake and alert Pain management: pain level controlled Vital Signs Assessment: post-procedure vital signs reviewed and stable Respiratory status: spontaneous breathing, nonlabored ventilation, respiratory function stable and patient connected to nasal cannula oxygen Cardiovascular status: blood pressure returned to baseline and stable Postop Assessment: no apparent nausea or vomiting Anesthetic complications: no   No notable events documented.   Last Vitals:  Vitals:   01/20/23 1131 01/20/23 1230  BP: 129/79 (!) 143/84  Pulse:  100  Resp:  18  Temp: 36.7 C 36.6 C  SpO2:  93%    Last Pain:  Vitals:   01/20/23 1151  TempSrc:   PainSc: 0-No pain                 Arita Miss

## 2023-01-20 NOTE — Progress Notes (Signed)
Progress Note   Patient: Maria Manning KLK:[id number] DOB: Oct 04, 1941 DOA: 01/18/2023     1 DOS: the patient was seen and examined on 01/20/2023   Brief hospital course: Taken from H&P.  RYAN PALERMO is a 82 y.o. 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  Most of the history is given by her daughter at the bedside who states that patient started having intermittent postprandial pain over the last few weeks but on the night of arrival it was very severe and she asked them to call EMS.   ED course and data review: Afebrile, tachypneic to 26 with otherwise unremarkable vitals.  WBC 10,700.  AST 177, ALT 178, alk phos 2021 total bilirubin 1.3.  Lipase 55.EKG, personally viewed and interpreted showing atrial flutter at 68 with no concerning ST-T wave changes. MRCP showing a 14 mm calculus within the distal common duct at the ampulla among other findings as outlined below: IMPRESSION: 1. Moderate intra and extrahepatic biliary ductal dilation secondary to a 14 mm calculus within the distal common duct at the ampulla. 2. Several simple appearing cysts seen scattered within the head and uncinate process of the pancreas as well as the distal body of the pancreas which demonstrate no significant restricted diffusion or enhancement and are most in keeping with multiple small dilated pancreatic side branches. 3. A single 8 mm cyst within the neck of the pancreas demonstrates restricted diffusion without associated abnormal enhancement. 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 No aggressive features are identified though  the examination is limited by motion artifact. This could be further assessed with EUS/FNA if clinically indicated. 4. Small hiatal hernia.  GI was consulted for ERCP.  1/22: Vital stable.  Worsening leukocytosis and procalcitonin at 2.27.  Starting her on ceftriaxone and Flagyl for concern of intra-abdominal infection.  Going for ERCP tomorrow morning. UA with moderate leukocytosis-cultures pending.  No urinary symptoms  1/23: Vitals with mildly elevated blood pressure at 157/74.  Labs with worsening hepatic function, worsening leukocytosis.  Blood cultures with Klebsiella pneumonia.  Urine cultures with insignificant growth. Patient had successful ERCP with sphincterotomy and sweeping of bile stone.  Tolerated the procedure well.   Assessment and Plan: * Choledocholithiasis, recurrent History of cholecystectomy. Procalcitonin elevated at 2.27 and patient continued to have significant abdominal pain MRCP showing stone at the ampulla. Starting her on ceftriaxone and Flagyl for concern of intra-abdominal infection. S/p ERCP with sphincterotomy and removal of bile stone. -Continue with pain management -Continue with antibiotics -Continue with supportive care -Advance diet as tolerated  Bacteremia due to Klebsiella pneumoniae Blood cultures positive for Klebsiella pneumonia.  Urine cultures with insignificant growth. -Continue with ceftriaxone and Flagyl.  Ascending cholangitis Might be the cause of Klebsiella pneumonia bacteremia. -Continue with ceftriaxone and Flagyl  Atrial flutter ([place]) Resolved. Appears new onset on EKG with no prior history of same. Cardiology saw the monitor and EKG and does not think that it is atrial flutter or fibrillation.  They signed off stating that we can reconsult if needed  Hypertensive urgency Resolved.  Blood pressure improved. -Continue with home amlodipine. -Continue to monitor  History of pulmonary embolism Not currently on  anticoagulation  Subjective: Patient with improved abdominal pain, just had her ERCP.  Physical Exam: Vitals:   01/20/23 0829 01/20/23 1008 01/20/23 1131 01/20/23 1230  BP: (!) 157/74 (!) 182/79 129/79 (!) 143/84  Pulse: 99 (!) 104  100  Resp: '18 17  18  '$ Temp: 98.8 F (37.1 C) (!) 97.5 F (36.4 C) 98 F (36.7 C) 97.9 F (36.6 C)  TempSrc: Oral Temporal Temporal   SpO2: 94% 94%  93%  Weight:  98 kg    Height:  '5\' 2"'$  (1.575 m)     General.  Obese elderly lady, in no acute distress. Pulmonary.  Lungs clear bilaterally, normal respiratory effort. CV.  Regular rate and rhythm, no JVD, rub or murmur. Abdomen.  Soft, nontender, nondistended, BS positive. CNS.  Alert and oriented .  No focal neurologic deficit. Extremities.  No edema, no cyanosis, pulses intact and symmetrical. Psychiatry.  Judgment and insight appears normal.   Data Reviewed: Prior data reviewed  Family Communication: Discussed with daughter at bedside  Disposition: Status is: Inpatient Remains inpatient appropriate because: Severity of illness  Planned Discharge Destination: Home   Time spent: 50 minutes  This record has been created using Systems analyst. Errors have been sought and corrected,but may not always be located. Such creation errors do not reflect on the standard of care.   Author: Lorella Nimrod, MD 01/20/2023 3:09 PM  For on call review [email or web address].

## 2023-01-20 NOTE — Assessment & Plan Note (Signed)
Might be the cause of Klebsiella pneumonia bacteremia. -Continue with ceftriaxone and Flagyl

## 2023-01-20 NOTE — Anesthesia Preprocedure Evaluation (Signed)
Anesthesia Evaluation  Patient identified by MRN, date of birth, ID band Patient awake    Reviewed: Allergy & Precautions, NPO status , Patient's Chart, lab work & pertinent test results  History of Anesthesia Complications Negative for: history of anesthetic complications  Airway Mallampati: II  TM Distance: >3 FB Neck ROM: Full    Dental  (+) Edentulous Lower, Edentulous Upper   Pulmonary neg pulmonary ROS, neg sleep apnea, neg COPD, Patient abstained from smoking.Not current smoker, former smoker   Pulmonary exam normal breath sounds clear to auscultation       Cardiovascular Exercise Tolerance: Good METShypertension, (-) CAD and (-) Past MI negative cardio ROS (-) dysrhythmias  Rhythm:Regular Rate:Normal - Systolic murmurs    Neuro/Psych negative neurological ROS  negative psych ROS   GI/Hepatic ,GERD  ,,(+)     (-) substance abuse  Two prior ERCPs. Last one in 2018. Patient says she has not gained any significant weight since then.   Endo/Other  neg diabetes    Renal/GU negative Renal ROS     Musculoskeletal   Abdominal  (+) + obese  Peds  Hematology   Anesthesia Other Findings Past Medical History: No date: Elevated lipids No date: GERD (gastroesophageal reflux disease) No date: Hypertension No date: PE (pulmonary embolism)  Reproductive/Obstetrics                             Anesthesia Physical Anesthesia Plan  ASA: 3  Anesthesia Plan: General   Post-op Pain Management: Minimal or no pain anticipated   Induction: Intravenous  PONV Risk Score and Plan: 3 and Propofol infusion, TIVA and Ondansetron  Airway Management Planned: Nasal Cannula  Additional Equipment: None  Intra-op Plan:   Post-operative Plan:   Informed Consent: I have reviewed the patients History and Physical, chart, labs and discussed the procedure including the risks, benefits and alternatives for  the proposed anesthesia with the patient or authorized representative who has indicated his/her understanding and acceptance.     Dental advisory given  Plan Discussed with: CRNA and Surgeon  Anesthesia Plan Comments: (Denies N/V today or recently. Discussed risks of anesthesia with patient, including possibility of difficulty with spontaneous ventilation under anesthesia necessitating airway intervention, PONV, and rare risks such as cardiac or respiratory or neurological events, and allergic reactions. Discussed the role of CRNA in patient's perioperative care. Patient understands.)       Anesthesia Quick Evaluation

## 2023-01-20 NOTE — Plan of Care (Signed)
Patient AOX4, VSS throughout shift.  Pt c/o pain relieved by PRN pain meds.  All meds given on time as ordered.  Diminished lungs, IS encouraged.  Purewick remains in place.  Pt's daughter at bedside.  POC maintained, will continue to monitor.  Problem: Education: Goal: Knowledge of General Education information will improve Description: Including pain rating scale, medication(s)/side effects and non-pharmacologic comfort measures Outcome: Progressing   Problem: Health Behavior/Discharge Planning: Goal: Ability to manage health-related needs will improve Outcome: Progressing   Problem: Clinical Measurements: Goal: Ability to maintain clinical measurements within normal limits will improve Outcome: Progressing Goal: Will remain free from infection Outcome: Progressing Goal: Diagnostic test results will improve Outcome: Progressing Goal: Respiratory complications will improve Outcome: Progressing Goal: Cardiovascular complication will be avoided Outcome: Progressing   Problem: Activity: Goal: Risk for activity intolerance will decrease Outcome: Progressing   Problem: Nutrition: Goal: Adequate nutrition will be maintained Outcome: Progressing   Problem: Coping: Goal: Level of anxiety will decrease Outcome: Progressing   Problem: Elimination: Goal: Will not experience complications related to bowel motility Outcome: Progressing Goal: Will not experience complications related to urinary retention Outcome: Progressing   Problem: Pain Managment: Goal: General experience of comfort will improve Outcome: Progressing   Problem: Safety: Goal: Ability to remain free from injury will improve Outcome: Progressing   Problem: Skin Integrity: Goal: Risk for impaired skin integrity will decrease Outcome: Progressing

## 2023-01-20 NOTE — Assessment & Plan Note (Signed)
Resolved.  Blood pressure improved. -Continue with home amlodipine. -Continue to monitor

## 2023-01-20 NOTE — Anesthesia Procedure Notes (Signed)
Procedure Name: MAC Date/Time: 01/20/2023 11:11 AM  Performed by: Biagio Borg, CRNAPre-anesthesia Checklist: Patient identified, Emergency Drugs available, Suction available, Patient being monitored and Timeout performed Patient Re-evaluated:Patient Re-evaluated prior to induction Oxygen Delivery Method: Nasal cannula Induction Type: IV induction Placement Confirmation: positive ETCO2 and CO2 detector

## 2023-01-20 NOTE — Assessment & Plan Note (Addendum)
Blood cultures positive for Klebsiella pneumonia and Aeromonas.  Urine cultures with insignificant growth-pending final susceptibility -Continue with ceftriaxone and Flagyl. -Repeat blood cultures-remain negative in 24-hour -Klebsiella susceptibilities still pending

## 2023-01-21 ENCOUNTER — Encounter: Payer: Self-pay | Admitting: Gastroenterology

## 2023-01-21 DIAGNOSIS — Z9049 Acquired absence of other specified parts of digestive tract: Secondary | ICD-10-CM | POA: Diagnosis not present

## 2023-01-21 DIAGNOSIS — R7881 Bacteremia: Secondary | ICD-10-CM | POA: Diagnosis not present

## 2023-01-21 DIAGNOSIS — K8050 Calculus of bile duct without cholangitis or cholecystitis without obstruction: Secondary | ICD-10-CM | POA: Diagnosis not present

## 2023-01-21 DIAGNOSIS — K8309 Other cholangitis: Secondary | ICD-10-CM | POA: Diagnosis not present

## 2023-01-21 DIAGNOSIS — B961 Klebsiella pneumoniae [K. pneumoniae] as the cause of diseases classified elsewhere: Secondary | ICD-10-CM

## 2023-01-21 LAB — COMPREHENSIVE METABOLIC PANEL
ALT: 216 U/L — ABNORMAL HIGH (ref 0–44)
AST: 82 U/L — ABNORMAL HIGH (ref 15–41)
Albumin: 2.5 g/dL — ABNORMAL LOW (ref 3.5–5.0)
Alkaline Phosphatase: 143 U/L — ABNORMAL HIGH (ref 38–126)
Anion gap: 7 (ref 5–15)
BUN: 15 mg/dL (ref 8–23)
CO2: 25 mmol/L (ref 22–32)
Calcium: 7.9 mg/dL — ABNORMAL LOW (ref 8.9–10.3)
Chloride: 99 mmol/L (ref 98–111)
Creatinine, Ser: 0.59 mg/dL (ref 0.44–1.00)
GFR, Estimated: >60 mL/min (ref >60)
Glucose, Bld: 102 mg/dL — ABNORMAL HIGH (ref 70–99)
Potassium: 3.5 mmol/L (ref 3.5–5.1)
Sodium: 131 mmol/L — ABNORMAL LOW (ref 135–145)
Total Bilirubin: 1.8 mg/dL — ABNORMAL HIGH (ref 0.3–1.2)
Total Protein: 5.8 g/dL — ABNORMAL LOW (ref 6.5–8.1)

## 2023-01-21 LAB — CBC
HCT: 33.4 % — ABNORMAL LOW (ref 36.0–46.0)
Hemoglobin: 11.5 g/dL — ABNORMAL LOW (ref 12.0–15.0)
MCH: 30.5 pg (ref 26.0–34.0)
MCHC: 34.4 g/dL (ref 30.0–36.0)
MCV: 88.6 fL (ref 80.0–100.0)
Platelets: 196 10*3/uL (ref 150–400)
RBC: 3.77 MIL/uL — ABNORMAL LOW (ref 3.87–5.11)
RDW: 13.8 % (ref 11.5–15.5)
WBC: 19.2 10*3/uL — ABNORMAL HIGH (ref 4.0–10.5)
nRBC: 0 % (ref 0.0–0.2)

## 2023-01-21 LAB — LIPASE, BLOOD: Lipase: 33 U/L (ref 11–51)

## 2023-01-21 LAB — PROCALCITONIN: Procalcitonin: 5.61 ng/mL

## 2023-01-21 MED ORDER — AMLODIPINE BESYLATE 5 MG PO TABS
5.0000 mg | ORAL_TABLET | Freq: Once | ORAL | Status: AC
  Administered 2023-01-21: 5 mg via ORAL
  Filled 2023-01-21: qty 1

## 2023-01-21 MED ORDER — SODIUM CHLORIDE 0.9 % IV SOLN
2.0000 g | Freq: Three times a day (TID) | INTRAVENOUS | Status: DC
  Administered 2023-01-21 – 2023-01-23 (×6): 2 g via INTRAVENOUS
  Filled 2023-01-21 (×7): qty 2

## 2023-01-21 MED ORDER — LACTATED RINGERS IV SOLN: INTRAVENOUS | Status: AC

## 2023-01-21 MED ORDER — ENOXAPARIN SODIUM 60 MG/0.6ML IJ SOSY
0.5000 mg/kg | PREFILLED_SYRINGE | INTRAMUSCULAR | Status: DC
  Administered 2023-01-21 – 2023-01-22 (×2): 50 mg via SUBCUTANEOUS
  Filled 2023-01-21 (×2): qty 0.6

## 2023-01-21 NOTE — Evaluation (Signed)
Physical Therapy Evaluation Patient Details Name: Maria Manning MRN: [id number] DOB: 10/28/1941 Today's Date: 01/21/2023  History of Present Illness  Maria Manning is an 55yoF who comes to [facility] on 1[facility]21 c ABD pain and digestive distress, weeks of reported postprandial pain. Pt went for ERCP on 01/20/23 for bile duct stone. PMH: HTN, PE, recurrent choledocholithiasis, cholecystectomy, Rt hip DJD, Rt sciatica.  Clinical Impression  Pt in bed on entry, pain reported as 5/10, but affect would indicate a bit higher/more limiting. 2 DTR and 1 GrandDTR at bedside. Pt had dilaudid 2.5 hours prior, but still feels pretty awful. She is agreeable to trial of bed mobility to EOB, moving well in general except for high pain levels. She does well sitting at EOB balanced, Pryor Curia emphasized what ever amount of deep breathing and pulmonary toilet that she can tolerate which is not much given her ABD pain. Pt decline attempt to stand without pain being lower. Anticipate rapid return of strength and function once pain improves. Pt will have lots of family support for assist at DC.      Recommendations for follow up therapy are one component of a multi-disciplinary discharge planning process, led by the attending physician.  Recommendations may be updated based on patient status, additional functional criteria and insurance authorization.  Follow Up Recommendations Home health PT      Assistance Recommended at Discharge Intermittent Supervision/Assistance  Patient can return home with the following  A little help with walking and/or transfers;A little help with bathing/dressing/bathroom;Assistance with cooking/housework;Assist for transportation;Help with stairs or ramp for entrance    Equipment Recommendations None recommended by PT  Recommendations for Other Services       Functional Status Assessment Patient has had a recent decline in their functional status and demonstrates the ability to make  significant improvements in function in a reasonable and predictable amount of time.     Precautions / Restrictions Precautions Precautions: Fall Restrictions Weight Bearing Restrictions: No      Mobility  Bed Mobility Overal bed mobility: Needs Assistance Bed Mobility: Supine to Sit     Supine to sit: Supervision     General bed mobility comments: uses rail, moving limited by pain mostly; sits balanced at EOB.    Transfers Overall transfer level:  (Pt declines, would need better pain control before attempting.)                      Ambulation/Gait                  Stairs            Wheelchair Mobility    Modified Rankin (Stroke Patients Only)       Balance                                             Pertinent Vitals/Pain Pain Assessment Pain Assessment: 0-10 Pain Score: 5  Pain Location: ABD pain Pain Intervention(s): Limited activity within patient's tolerance, Monitored during session, Premedicated before session    [place] expects to be discharged to:: Private residence Living Arrangements: Children (Terri) Available Help at Discharge: Family Type of Home: Mobile home Home Access: Stairs to enter Entrance Stairs-Rails: Chemical engineer of Steps: 2   Home Layout: One level Home Equipment: BSC/3in1;Shower seat;Rolling Environmental consultant (2 wheels) Additional Comments: belonged to husband previously, she  was not using.    Prior Function Prior Level of Function : Independent/Modified Independent                     Hand Dominance        Extremity/Trunk Assessment                Communication      Cognition Arousal/Alertness: Awake/alert Behavior During Therapy: WFL for tasks assessed/performed Overall Cognitive Status: Within Functional Limits for tasks assessed                                          General Comments      Exercises      Assessment/Plan    PT Assessment Patient needs continued PT services  PT Problem List Decreased strength;Decreased activity tolerance;Decreased balance;Decreased mobility       PT Treatment Interventions DME instruction;Gait training;Stair training;Functional mobility training;Therapeutic activities;Therapeutic exercise;Balance training;Patient/family education;Manual techniques    PT Goals (Current goals can be found in the Care Plan section)  Acute Rehab PT Goals Patient Stated Goal: imrpoved pain control PT Goal Formulation: With patient Time For Goal Achievement: 02/04/23 Potential to Achieve Goals: Good    Frequency Min 2X/week     Co-evaluation               AM-PAC PT "6 Clicks" Mobility  Outcome Measure Help needed turning from your back to your side while in a flat bed without using bedrails?: A Little Help needed moving from lying on your back to sitting on the side of a flat bed without using bedrails?: A Little Help needed moving to and from a bed to a chair (including a wheelchair)?: A Little Help needed standing up from a chair using your arms (e.g., wheelchair or bedside chair)?: A Little Help needed to walk in hospital room?: A Little Help needed climbing 3-5 steps with a railing? : A Little 6 Click Score: 18    End of Session   Activity Tolerance: Patient limited by pain;Patient limited by fatigue Patient left: in bed;with family/visitor present;with call bell/phone within reach Nurse Communication: Mobility status PT Visit Diagnosis: Difficulty in walking, not elsewhere classified (R26.2);Other abnormalities of gait and mobility (R26.89);Muscle weakness (generalized) (M62.81)    Time: 9470-9628 PT Time Calculation (min) (ACUTE ONLY): 17 min   Charges:   PT Evaluation $PT Eval Low Complexity: 1 Low        3:38 PM, 01/21/23 Etta Grandchild, PT, DPT Physical Therapist - [employer]  [phone] ([place])     Gladys Gutman C 01/21/2023, 3:34 PM

## 2023-01-21 NOTE — Progress Notes (Signed)
Maria Darby, MD [address]  [place]  [place], [place] [address]  Main: [phone]  Fax: [phone] Pager: [phone]   Subjective: Patient reports ongoing abdominal pain in the periumbilical and epigastric area.  She has been coughing some sputum. She has been afebrile.  Patient's daughter is bedside.  Patient's daughter reported that she had a spell of upper abdominal pain last night   Objective: Vital signs in last 24 hours: Vitals:   01/21/23 0759 01/21/23 0836 01/21/23 1225 01/21/23 1745  BP: (!) 186/88 (!) 167/75 (!) 168/91 (!) 188/106  Pulse: 97 92 98 100  Resp: '20 20 18 18  '$ Temp: 98.4 F (36.9 C) 98.1 F (36.7 C) 98.1 F (36.7 C) 97.8 F (36.6 C)  TempSrc: Oral Oral Oral Oral  SpO2:  94% 92% 94%  Weight:      Height:       Weight change:   Intake/Output Summary (Last 24 hours) at 01/21/2023 1931 Last data filed at 01/21/2023 0315 Gross per 24 hour  Intake 70.96 ml  Output --  Net 70.96 ml     Exam: Heart:: Regular rate and rhythm, S1S2 present, or without murmur or extra heart sounds Lungs: clear to auscultation Abdomen: Soft, tenderness in the upper abdomen, no guarding or rigidity   Lab Results:    Latest Ref Rng & Units 01/21/2023    6:09 AM 01/20/2023    5:32 AM 01/18/2023    8:02 PM  CBC  WBC 4.0 - 10.5 K/uL 19.2  25.8  10.7   Hemoglobin 12.0 - 15.0 g/dL 11.5  12.7  13.0   Hematocrit 36.0 - 46.0 % 33.4  37.2  38.0   Platelets 150 - 400 K/uL 196  216  303       Latest Ref Rng & Units 01/21/2023    6:09 AM 01/20/2023    5:32 AM 01/18/2023    8:02 PM  CMP  Glucose 70 - 99 mg/dL 102  111  180   BUN 8 - 23 mg/dL '15  19  11   '$ Creatinine 0.44 - 1.00 mg/dL 0.59  0.72  0.65   Sodium 135 - 145 mmol/L 131  132  135   Potassium 3.5 - 5.1 mmol/L 3.5  3.7  3.3   Chloride 98 - 111 mmol/L 99  100  103   CO2 22 - 32 mmol/L '25  20  20   '$ Calcium 8.9 - 10.3 mg/dL 7.9  7.8  8.8   Total Protein 6.5 - 8.1 g/dL 5.8  6.3  7.3   Total  Bilirubin 0.3 - 1.2 mg/dL 1.8  2.7  1.3   Alkaline Phos 38 - 126 U/L 143  185  202   AST 15 - 41 U/L 82  273  177   ALT 0 - 44 U/L 216  405  178     Micro Results: Recent Results (from the past 240 hour(s))  Urine Culture     Status: Abnormal   Collection Time: 01/19/23 12:24 AM   Specimen: Urine, Clean Catch  Result Value Ref Range Status   Specimen Description   Final    URINE, CLEAN CATCH Performed at [facility], [address] [place], [place] [address]    Special Requests   Final    NONE Performed at [facility], [address] [place], [place] [address]    Culture (A)  Final    <10,000 COLONIES/mL INSIGNIFICANT GROWTH Performed at [facility], [place] [address], [place], [place] [address]    Report Status 01/20/2023 FINAL  Final  Blood culture (routine x 2)     Status: Abnormal (Preliminary result)   Collection Time: 01/19/23  5:42 AM   Specimen: BLOOD  Result Value Ref Range Status   Specimen Description   Final    BLOOD BLOOD RIGHT FOREARM Performed at [facility], [address], [place], [place] [address]    Special Requests   Final    BOTTLES DRAWN AEROBIC AND ANAEROBIC Blood Culture adequate volume Performed at [facility], [address], [place], [place] [address]    Culture  Setup Time   Final    GRAM NEGATIVE RODS IN BOTH AEROBIC AND ANAEROBIC BOTTLES CRITICAL VALUE NOTED.  VALUE IS CONSISTENT WITH PREVIOUSLY REPORTED AND CALLED VALUE. Performed at [facility], [address] [place], [place] [address]    Culture (A)  Final    AEROMONAS CAVIAE SUSCEPTIBILITIES PERFORMED ON PREVIOUS CULTURE WITHIN THE LAST 5 DAYS. CULTURE REINCUBATED FOR BETTER GROWTH Performed at [facility], [place] [address] [place], [place] [address]    Report Status PENDING  Incomplete  Blood culture (routine x 2)     Status: Abnormal (Preliminary result)   Collection Time: 01/19/23  5:42 AM   Specimen: BLOOD   Result Value Ref Range Status   Specimen Description   Final    BLOOD LEFT ANTECUBITAL Performed at [facility], [address] [place], [place] [address]    Special Requests   Final    BOTTLES DRAWN AEROBIC AND ANAEROBIC Blood Culture adequate volume Performed at [facility], [address] [place], [place] [address]    Culture  Setup Time   Final    GRAM NEGATIVE RODS IN BOTH AEROBIC AND ANAEROBIC BOTTLES CRITICAL RESULT CALLED TO, READ BACK BY AND VERIFIED WITH: JUSTIN MILLER  AT 1640 01/19/23 SS/RP    Culture (A)  Final    AEROMONAS CAVIAE KLEBSIELLA PNEUMONIAE CULTURE REINCUBATED FOR BETTER GROWTH repesting susceptibility Performed at [facility], [place] [address] [place], [place] [address]    Report Status PENDING  Incomplete   Organism ID, Bacteria AEROMONAS CAVIAE  Final      Susceptibility   Aeromonas caviae - MIC*    CEFAZOLIN 32 INTERMEDIATE Intermediate     CEFTAZIDIME <=1 SENSITIVE Sensitive     CIPROFLOXACIN <=0.25 SENSITIVE Sensitive     GENTAMICIN <=1 SENSITIVE Sensitive     IMIPENEM 0.5 SENSITIVE Sensitive     TRIMETH/SULFA <=20 SENSITIVE Sensitive     * AEROMONAS CAVIAE  Blood Culture ID Panel (Reflexed)     Status: Abnormal   Collection Time: 01/19/23  5:42 AM  Result Value Ref Range Status   Enterococcus faecalis NOT DETECTED NOT DETECTED Final   Enterococcus Faecium NOT DETECTED NOT DETECTED Final   Listeria monocytogenes NOT DETECTED NOT DETECTED Final   Staphylococcus species NOT DETECTED NOT DETECTED Final   Staphylococcus aureus (BCID) NOT DETECTED NOT DETECTED Final   Staphylococcus epidermidis NOT DETECTED NOT DETECTED Final   Staphylococcus lugdunensis NOT DETECTED NOT DETECTED Final   Streptococcus species NOT DETECTED NOT DETECTED Final   Streptococcus agalactiae NOT DETECTED NOT DETECTED Final   Streptococcus pneumoniae NOT DETECTED NOT DETECTED Final   Streptococcus pyogenes NOT DETECTED NOT DETECTED Final    A.calcoaceticus-baumannii NOT DETECTED NOT DETECTED Final   Bacteroides fragilis NOT DETECTED NOT DETECTED Final   Enterobacterales DETECTED (A) NOT DETECTED  Final    Comment: Enterobacterales represent a large order of gram negative bacteria, not a single organism. CRITICAL RESULT CALLED TO, READ BACK BY AND VERIFIED WITH: JUSTIN MILLER AT 1640 ON 01/19/23 SS/RP    Enterobacter cloacae complex NOT DETECTED NOT DETECTED Final   Escherichia coli NOT DETECTED NOT DETECTED Final   Klebsiella aerogenes NOT DETECTED NOT DETECTED Final   Klebsiella oxytoca NOT DETECTED NOT DETECTED Final   Klebsiella pneumoniae DETECTED (A) NOT DETECTED Final    Comment: CRITICAL RESULT CALLED TO, READ BACK BY AND VERIFIED WITH: JUSTIN MILLER  AT 1640 ON 01/19/23 SS/RP    Proteus species NOT DETECTED NOT DETECTED Final   Salmonella species NOT DETECTED NOT DETECTED Final   Serratia marcescens NOT DETECTED NOT DETECTED Final   Haemophilus influenzae NOT DETECTED NOT DETECTED Final   Neisseria meningitidis NOT DETECTED NOT DETECTED Final   Pseudomonas aeruginosa NOT DETECTED NOT DETECTED Final   Stenotrophomonas maltophilia NOT DETECTED NOT DETECTED Final   Candida albicans NOT DETECTED NOT DETECTED Final   Candida auris NOT DETECTED NOT DETECTED Final   Candida glabrata NOT DETECTED NOT DETECTED Final   Candida krusei NOT DETECTED NOT DETECTED Final   Candida parapsilosis NOT DETECTED NOT DETECTED Final   Candida tropicalis NOT DETECTED NOT DETECTED Final   Cryptococcus neoformans/gattii NOT DETECTED NOT DETECTED Final   CTX-M ESBL NOT DETECTED NOT DETECTED Final   Carbapenem resistance IMP NOT DETECTED NOT DETECTED Final   Carbapenem resistance KPC NOT DETECTED NOT DETECTED Final   Carbapenem resistance NDM NOT DETECTED NOT DETECTED Final   Carbapenem resist OXA 48 LIKE NOT DETECTED NOT DETECTED Final   Carbapenem resistance VIM NOT DETECTED NOT DETECTED Final    Comment: Performed at [facility], [address] [place], [place] [address]   Studies/Results: DG C-Arm 1-60 Min-No Report  Result Date: 01/20/2023 Fluoroscopy was utilized by the requesting physician.  No radiographic interpretation.   Medications: I have reviewed the patient's current medications. Prior to Admission:  Medications Prior to Admission  Medication Sig Dispense Refill Last Dose   ALPRAZolam (XANAX) 0.25 MG tablet Take 0.25 mg by mouth at bedtime as needed for sleep.   prn   amLODipine (NORVASC) 5 MG tablet Take 5 mg by mouth daily.   01/18/2023   aspirin 81 MG tablet Take 81 mg by mouth daily.   01/18/2023   Calcium Carbonate-Vitamin D3 (CALCIUM 600-D) 600-400 MG-UNIT TABS Take 1 tablet by mouth 2 (two) times daily.    01/18/2023   losartan (COZAAR) 100 MG tablet Take 100 mg by mouth daily.   01/18/2023   metoprolol tartrate (LOPRESSOR) 25 MG tablet Take 25 mg by mouth 2 (two) times daily.   01/18/2023 at 1900   Multiple Vitamin (MULTIVITAMIN) tablet Take 1 tablet by mouth daily.   01/18/2023   Omeprazole 20 MG TBEC Take 20 mg by mouth daily.    01/18/2023   acetaminophen (TYLENOL) 500 MG tablet Take 500-1,000 mg by mouth every 6 (six) hours as needed for mild pain, moderate pain or fever.      Scheduled:  amLODipine  5 mg Oral Daily   amLODipine  5 mg Oral Once   enoxaparin (LOVENOX) injection  0.5 mg/kg Subcutaneous Q24H   metoprolol tartrate  5 mg Intravenous Q6H   pantoprazole  40 mg Oral Daily   Continuous:  cefTAZidime (FORTAZ)  IV     lactated ringers 125 mL/hr at 01/21/23 0315   lactated ringers 100 mL/hr at  01/21/23 1219   metronidazole 500 mg (01/21/23 1335)   TTS:[id number] **OR** acetaminophen, HYDROmorphone (DILAUDID) injection, ondansetron **OR** ondansetron (ZOFRAN) IV Anti-infectives (From admission, onward)    Start     Dose/Rate Route Frequency Ordered Stop   01/21/23 2200  cefTAZidime (FORTAZ) 2 g in sodium chloride 0.9 % 100 mL IVPB        2 g 200 mL/hr over 30 Minutes  Intravenous Every 8 hours 01/21/23 1515     01/19/23 1230  metroNIDAZOLE (FLAGYL) IVPB 500 mg        500 mg 100 mL/hr over 60 Minutes Intravenous Every 12 hours 01/19/23 1104     01/19/23 1130  cefTRIAXone (ROCEPHIN) 2 g in sodium chloride 0.9 % 100 mL IVPB  Status:  Discontinued        2 g 200 mL/hr over 30 Minutes Intravenous Every 24 hours 01/19/23 1104 01/21/23 1515      Scheduled Meds:  amLODipine  5 mg Oral Daily   amLODipine  5 mg Oral Once   enoxaparin (LOVENOX) injection  0.5 mg/kg Subcutaneous Q24H   metoprolol tartrate  5 mg Intravenous Q6H   pantoprazole  40 mg Oral Daily   Continuous Infusions:  cefTAZidime (FORTAZ)  IV     lactated ringers 125 mL/hr at 01/21/23 0315   lactated ringers 100 mL/hr at 01/21/23 1219   metronidazole 500 mg (01/21/23 1335)   PRN Meds:.acetaminophen **OR** acetaminophen, HYDROmorphone (DILAUDID) injection, ondansetron **OR** ondansetron (ZOFRAN) IV   Assessment: Principal Problem:   Choledocholithiasis, recurrent Active Problems:   History of pulmonary embolism   History of cholecystectomy   Hypertensive urgency   Atrial flutter (HCC)   Ascending cholangitis   Bacteremia due to Klebsiella pneumoniae  Maria Manning is a 82 y.o. Caucasian female with history of cholelithiasis, s/p cholecystectomy, recurrent choledocholithiasis s/p ERCP with biliary sphincterotomy and stone extraction, most recently in 2018, hypertension, hyperlipidemia, obesity is admitted with recurrent choledocholithiasis and gram-negative bacteremia concerning for ascending cholangitis   Plan: Recurrent choledocholithiasis with gram-negative bacteremia concerning for ascending cholangitis No evidence of gallstone pancreatitis Patient is complaining of upper abdominal discomfort, unclear if it is worse compared to before, check serum lipase levels S/p ERCP on 1/23-choledocholithiasis was found, multiple stones were removed, biliary sphincterotomy was  performed LFTs and leukocytosis are improving Continue antibiotics for Klebsiella pneumoniae and Enterobacterales.   Can switch to ciprofloxacin for 2 weeks if repeat cultures are negative Advance diet as tolerated    LOS: 2 days   Florice Hindle 01/21/2023, 7:31 PM

## 2023-01-21 NOTE — TOC CM/SW Note (Signed)
  Transition of Care [facility]) Screening Note   Patient Details  Name: Maria Manning Date of Birth: Feb 16, 1941   Transition [facility] Care [facility]) CM/SW Contact:    Candie Chroman, LCSW Phone Number: 01/21/2023, 8:26 AM    Transition of Care Department [facility]) has reviewed patient and no TOC needs have been identified at this time. We will continue to monitor patient advancement through interdisciplinary progression rounds. If new patient transition needs arise, please place a TOC consult.

## 2023-01-21 NOTE — Progress Notes (Signed)
Progress Note   Patient: Maria Manning KZS:[id number] DOB: Apr 28, 1941 DOA: 01/18/2023     2 DOS: the patient was seen and examined on 01/21/2023   Brief hospital course: Taken from H&P.  IMARI REEN is a 82 y.o. 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  Most of the history is given by her daughter at the bedside who states that patient started having intermittent postprandial pain over the last few weeks but on the night of arrival it was very severe and she asked them to call EMS.   ED course and data review: Afebrile, tachypneic to 26 with otherwise unremarkable vitals.  WBC 10,700.  AST 177, ALT 178, alk phos 2021 total bilirubin 1.3.  Lipase 55.EKG, personally viewed and interpreted showing atrial flutter at 68 with no concerning ST-T wave changes. MRCP showing a 14 mm calculus within the distal common duct at the ampulla among other findings as outlined below: IMPRESSION: 1. Moderate intra and extrahepatic biliary ductal dilation secondary to a 14 mm calculus within the distal common duct at the ampulla. 2. Several simple appearing cysts seen scattered within the head and uncinate process of the pancreas as well as the distal body of the pancreas which demonstrate no significant restricted diffusion or enhancement and are most in keeping with multiple small dilated pancreatic side branches. 3. A single 8 mm cyst within the neck of the pancreas demonstrates restricted diffusion without associated abnormal enhancement. 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 No aggressive features are identified though  the examination is limited by motion artifact. This could be further assessed with EUS/FNA if clinically indicated. 4. Small hiatal hernia.  GI was consulted for ERCP.  1/22: Vital stable.  Worsening leukocytosis and procalcitonin at 2.27.  Starting her on ceftriaxone and Flagyl for concern of intra-abdominal infection.  Going for ERCP tomorrow morning. UA with moderate leukocytosis-cultures pending.  No urinary symptoms  1/23: Vitals with mildly elevated blood pressure at 157/74.  Labs with worsening hepatic function, worsening leukocytosis.  Blood cultures with Klebsiella pneumonia.  Urine cultures with insignificant growth. Patient had successful ERCP with sphincterotomy and sweeping of bile stone.  Tolerated the procedure well.  1/24: Hemodynamically stable with mildly elevated blood pressure at 167/75.  Patient continued to have significant pain.  Labs with improving hepatic function.  Slight worsening of hyponatremia to 131.  CBC with some improvement of leukocytosis to 19.2, hemoglobin at 11.5.  Slight worsening of procalcitonin to 5.  Blood cultures with second organism called Aeromonas along with Klebsiella pneumonia.  Repeating blood cultures.   Assessment and Plan: * Choledocholithiasis, recurrent History of cholecystectomy.  Procalcitonin elevated at 2.27>>5 and patient continued to have significant abdominal pain MRCP showing stone at the ampulla. Starting her on ceftriaxone and Flagyl for concern of intra-abdominal infection. S/p ERCP with sphincterotomy and removal of bile stone. -Continue with pain management -Continue with antibiotics -Continue with supportive care -Advance diet as tolerated  Bacteremia due to Klebsiella pneumoniae Blood cultures positive for Klebsiella pneumonia and Aeromonas.  Urine cultures with insignificant growth-pending final susceptibility -Continue with ceftriaxone and Flagyl. -Repeat blood cultures  Ascending cholangitis Might be the cause  of Klebsiella pneumonia bacteremia. -  Continue with ceftriaxone and Flagyl  Atrial flutter ([place]) Resolved. Appears new onset on EKG with no prior history of same. Cardiology saw the monitor and EKG and does not think that it is atrial flutter or fibrillation.  They signed off stating that we can reconsult if needed  Hypertensive urgency Resolved.  Blood pressure improved. -Continue with home amlodipine. -Continue to monitor  History of pulmonary embolism Not currently on anticoagulation    Subjective: Patient seen and examined today.  Continued to have epigastric and left upper quadrant pain requiring frequent pain medications.  No fever or chills.  Physical Exam: Vitals:   01/21/23 0511 01/21/23 0759 01/21/23 0836 01/21/23 1225  BP: (!) 170/95 (!) 186/88 (!) 167/75 (!) 168/91  Pulse: 93 97 92 98  Resp: '18 20 20 18  '$ Temp: 98.8 F (37.1 C) 98.4 F (36.9 C) 98.1 F (36.7 C) 98.1 F (36.7 C)  TempSrc: Oral Oral Oral Oral  SpO2: 94%  94% 92%  Weight:      Height:       General.  Obese elderly lady, in no acute distress. Pulmonary.  Lungs clear bilaterally, normal respiratory effort. CV.  Regular rate and rhythm, no JVD, rub or murmur. Abdomen.  Soft, mild epigastric tenderness, nondistended, BS positive. CNS.  Alert and oriented .  No focal neurologic deficit. Extremities.  No edema, no cyanosis, pulses intact and symmetrical. Psychiatry.  Judgment and insight appears normal.   Data Reviewed: Prior data reviewed  Family Communication: Discussed with daughter at bedside  Disposition: Status is: Inpatient Remains inpatient appropriate because: Severity of illness  Planned Discharge Destination: Home   Time spent: 50 minutes  This record has been created using Systems analyst. Errors have been sought and corrected,but may not always be located. Such creation errors do not reflect on the standard of care.   Author: Lorella Nimrod, MD 01/21/2023 2:22  PM  For on call review [email or web address].

## 2023-01-22 DIAGNOSIS — K8050 Calculus of bile duct without cholangitis or cholecystitis without obstruction: Secondary | ICD-10-CM | POA: Diagnosis not present

## 2023-01-22 DIAGNOSIS — R7881 Bacteremia: Secondary | ICD-10-CM | POA: Diagnosis not present

## 2023-01-22 DIAGNOSIS — I4892 Unspecified atrial flutter: Secondary | ICD-10-CM

## 2023-01-22 DIAGNOSIS — K8309 Other cholangitis: Secondary | ICD-10-CM | POA: Diagnosis not present

## 2023-01-22 DIAGNOSIS — Z9049 Acquired absence of other specified parts of digestive tract: Secondary | ICD-10-CM | POA: Diagnosis not present

## 2023-01-22 LAB — CBC
HCT: 34.2 % — ABNORMAL LOW (ref 36.0–46.0)
Hemoglobin: 11.8 g/dL — ABNORMAL LOW (ref 12.0–15.0)
MCH: 30.2 pg (ref 26.0–34.0)
MCHC: 34.5 g/dL (ref 30.0–36.0)
MCV: 87.5 fL (ref 80.0–100.0)
Platelets: 210 10*3/uL (ref 150–400)
RBC: 3.91 MIL/uL (ref 3.87–5.11)
RDW: 13.7 % (ref 11.5–15.5)
WBC: 12.6 10*3/uL — ABNORMAL HIGH (ref 4.0–10.5)
nRBC: 0 % (ref 0.0–0.2)

## 2023-01-22 LAB — PROCALCITONIN: Procalcitonin: 3.58 ng/mL

## 2023-01-22 MED ORDER — AMLODIPINE BESYLATE 5 MG PO TABS
5.0000 mg | ORAL_TABLET | Freq: Once | ORAL | Status: AC
  Administered 2023-01-22: 5 mg via ORAL
  Filled 2023-01-22: qty 1

## 2023-01-22 MED ORDER — LOSARTAN POTASSIUM 50 MG PO TABS
100.0000 mg | ORAL_TABLET | Freq: Every day | ORAL | Status: DC
  Administered 2023-01-22 – 2023-01-23 (×2): 100 mg via ORAL
  Filled 2023-01-22 (×2): qty 2

## 2023-01-22 MED ORDER — METOPROLOL TARTRATE 5 MG/5ML IV SOLN: 5.0000 mg | Freq: Four times a day (QID) | INTRAVENOUS | Status: DC | PRN

## 2023-01-22 MED ORDER — AMLODIPINE BESYLATE 10 MG PO TABS
10.0000 mg | ORAL_TABLET | Freq: Every day | ORAL | Status: DC
  Administered 2023-01-23: 10 mg via ORAL
  Filled 2023-01-22: qty 1

## 2023-01-22 MED ORDER — METOPROLOL TARTRATE 25 MG PO TABS
25.0000 mg | ORAL_TABLET | Freq: Two times a day (BID) | ORAL | Status: DC
  Administered 2023-01-22 – 2023-01-23 (×3): 25 mg via ORAL
  Filled 2023-01-22 (×3): qty 1

## 2023-01-22 MED ORDER — POLYETHYLENE GLYCOL 3350 17 G PO PACK
17.0000 g | PACK | Freq: Every day | ORAL | Status: DC
  Administered 2023-01-22 – 2023-01-23 (×2): 17 g via ORAL
  Filled 2023-01-22 (×2): qty 1

## 2023-01-22 NOTE — Care Management Important Message (Signed)
Important Message  Patient Details  Name: KRUPA STEGE MRN: [id number] Date of Birth: 1941/06/16   Medicare Important Message Given:  Yes     Dannette Barbara 01/22/2023, 2:09 PM

## 2023-01-22 NOTE — TOC Initial Note (Signed)
Transition of Care [facility]) - Initial/Assessment Note    Patient Details  Name: Maria Manning MRN: [id number] Date of Birth: May 24, 1941  Transition of Care [facility]) CM/SW Contact:    Maria Sessions, RN Phone Number: 01/22/2023, 3:08 PM  Clinical Narrative:                 Admitted WNI:[id number]  Admitted from: Home.  States that daughter Maria Manning will be staying with her  PCP: Maria Manning Current home health/prior home health/DME: [facility], shower seat, RW  Therapy recommending home health. Met with patient at bedside to discuss.  Patient declines.  Informed patient if she changes her mind to let nursing know, or she can follow up with her PCP         Patient Goals and CMS Choice            Expected Discharge Plan and Services                                              Prior Living Arrangements/Services                       Activities of Daily Living Home Assistive Devices/Equipment: None ADL Screening (condition at time of admission) Patient's cognitive ability adequate to safely complete daily activities?: No Is the patient deaf or have difficulty hearing?: No Does the patient have difficulty seeing, even when wearing glasses/contacts?: No Does the patient have difficulty concentrating, remembering, or making decisions?: No Patient able to express need for assistance with ADLs?: Yes Does the patient have difficulty dressing or bathing?: No Independently performs ADLs?: Yes (appropriate for developmental age) Does the patient have difficulty walking or climbing stairs?: No Weakness of Legs: None Weakness of Arms/Hands: None  Permission Sought/Granted                  Emotional Assessment              Admission diagnosis:  Choledocholithiasis [K80.50] Patient Active Problem List   Diagnosis Date Noted   Bacteremia due to Klebsiella pneumoniae 01/20/2023   Benign essential hypertension 01/19/2023   History of cholecystectomy  01/19/2023   Hypertensive urgency 01/19/2023   Atrial flutter ([place]) 01/19/2023   Ascending cholangitis 01/19/2023   Hyponatremia 08/06/2019   Gallstones 04/21/2017   History of pulmonary embolism 04/21/2017   Choledocholithiasis, recurrent    PCP:  Maria Aus, MD Pharmacy:   [facility] [address], [place] [address] Phone: [phone] Fax: [phone]     Social Determinants of Health (SDOH) Social History: SDOH Screenings   Food Insecurity: No Food Insecurity (01/20/2023)  Housing: Low Risk  (01/20/2023)  Transportation Needs: No Transportation Needs (01/20/2023)  Utilities: Not At Risk (01/20/2023)  Tobacco Use: Medium Risk (01/21/2023)   SDOH Interventions:     Readmission Risk Interventions     No data to display

## 2023-01-22 NOTE — Progress Notes (Signed)
Mobility Specialist - Progress Note   01/22/23 1519  Mobility  Activity Transferred from bed to chair;Ambulated with assistance in room  Level of Assistance Standby assist, set-up cues, supervision of patient - no hands on  Assistive Device None  Distance Ambulated (ft) 4 ft  Activity Response Tolerated well  Mobility Referral Yes  $Mobility charge 1 Mobility   Pt supine upon entry, utilizing RA. Pt denied OOB amb in the hallway to this date. Pt completed bed mob ModI, STS MinA and amb SBA. Pt completed an ambulating transfer to the recliner, tolerated well. Pt left in recliner with alarm set and needs within reach.   Candie Mile Mobility Specialist 01/22/23 3:23 PM

## 2023-01-22 NOTE — Progress Notes (Signed)
Progress Note   Patient: Maria Manning SPQ:[id number] DOB: 1941/04/14 DOA: 01/18/2023     3 DOS: the patient was seen and examined on 01/22/2023   Brief hospital course: Taken from H&P.  Maria Manning is a 82 y.o. 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  Most of the history is given by her daughter at the bedside who states that patient started having intermittent postprandial pain over the last few weeks but on the night of arrival it was very severe and she asked them to call EMS.   ED course and data review: Afebrile, tachypneic to 26 with otherwise unremarkable vitals.  WBC 10,700.  AST 177, ALT 178, alk phos 2021 total bilirubin 1.3.  Lipase 55.EKG, personally viewed and interpreted showing atrial flutter at 68 with no concerning ST-T wave changes. MRCP showing a 14 mm calculus within the distal common duct at the ampulla among other findings as outlined below: IMPRESSION: 1. Moderate intra and extrahepatic biliary ductal dilation secondary to a 14 mm calculus within the distal common duct at the ampulla. 2. Several simple appearing cysts seen scattered within the head and uncinate process of the pancreas as well as the distal body of the pancreas which demonstrate no significant restricted diffusion or enhancement and are most in keeping with multiple small dilated pancreatic side branches. 3. A single 8 mm cyst within the neck of the pancreas demonstrates restricted diffusion without associated abnormal enhancement. 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 No aggressive features are identified though  the examination is limited by motion artifact. This could be further assessed with EUS/FNA if clinically indicated. 4. Small hiatal hernia.  GI was consulted for ERCP.  1/22: Vital stable.  Worsening leukocytosis and procalcitonin at 2.27.  Starting her on ceftriaxone and Flagyl for concern of intra-abdominal infection.  Going for ERCP tomorrow morning. UA with moderate leukocytosis-cultures pending.  No urinary symptoms  1/23: Vitals with mildly elevated blood pressure at 157/74.  Labs with worsening hepatic function, worsening leukocytosis.  Blood cultures with Klebsiella pneumonia.  Urine cultures with insignificant growth. Patient had successful ERCP with sphincterotomy and sweeping of bile stone.  Tolerated the procedure well.  1/24: Hemodynamically stable with mildly elevated blood pressure at 167/75.  Patient continued to have significant pain.  Labs with improving hepatic function.  Slight worsening of hyponatremia to 131.  CBC with some improvement of leukocytosis to 19.2, hemoglobin at 11.5.  Slight worsening of procalcitonin to 5.  Blood cultures with second organism called Aeromonas along with Klebsiella pneumonia.  Repeating blood cultures.  1/25: Vitals with elevated blood pressure.  Restarting home losartan and metoprolol.  Procalcitonin and leukocytosis improving.  Repeat blood cultures remain negative in 24-hour. Klebsiella pneumonia susceptibilities are still pending.  Most likely she will be discharged  on ciprofloxacin to complete a total of 2 weeks of antibiotics.   Assessment and Plan: * Choledocholithiasis, recurrent History of cholecystectomy.  Procalcitonin elevated at 2.27>>5 and patient continued to have significant abdominal pain MRCP showing stone at the ampulla. Starting her on ceftriaxone and Flagyl for concern of intra-abdominal infection. S/p ERCP with sphincterotomy and removal of bile stone. -Continue with pain management -Continue with  antibiotics -Continue with supportive care -Advance diet as tolerated  Bacteremia due to Klebsiella pneumoniae Blood cultures positive for Klebsiella pneumonia and Aeromonas.  Urine cultures with insignificant growth-pending final susceptibility -Continue with ceftriaxone and Flagyl. -Repeat blood cultures-remain negative in 24-hour -Klebsiella susceptibilities still pending  Ascending cholangitis Might be the cause of Klebsiella pneumonia bacteremia. -Continue with ceftriaxone and Flagyl  Atrial flutter (HCC) Resolved. Appears new onset on EKG with no prior history of same. Cardiology saw the monitor and EKG and does not think that it is atrial flutter or fibrillation.  They signed off stating that we can reconsult if needed  Hypertensive urgency Resolved.  Blood pressure improved. -Continue with home amlodipine. -Continue to monitor  History of pulmonary embolism Not currently on anticoagulation    Subjective: Patient was feeling little improved with less pain today.  Patient would like to get out of bed little more before discharge.  Physical Exam: Vitals:   01/21/23 2336 01/22/23 0542 01/22/23 0823 01/22/23 1312  BP: (!) 182/96 (!) 169/85 (!) 197/98 (!) 183/94  Pulse: (!) 101 88 86 91  Resp:  '18 20 20  '$ Temp:  98.1 F (36.7 C) 97.8 F (36.6 C) 98 F (36.7 C)  TempSrc:  Oral Oral Oral  SpO2:  95% 94% 96%  Weight:      Height:       General.  Obese elderly lady, in no acute distress. Pulmonary.  Lungs clear bilaterally, normal respiratory effort. CV.  Regular rate and rhythm, no JVD, rub or murmur. Abdomen.  Soft, nontender, nondistended, BS positive. CNS.  Alert and oriented .  No focal neurologic deficit. Extremities.  No edema, no cyanosis, pulses intact and symmetrical. Psychiatry.  Judgment and insight appears normal.  Data Reviewed: Prior data reviewed  Family Communication: Discussed with daughter at bedside  Disposition: Status is:  Inpatient Remains inpatient appropriate because: Severity of illness  Planned Discharge Destination: Home   Time spent: 45 minutes  This record has been created using Systems analyst. Errors have been sought and corrected,but may not always be located. Such creation errors do not reflect on the standard of care.   Author: Lorella Nimrod, MD 01/22/2023 2:40 PM  For on call review [email or web address].

## 2023-01-23 DIAGNOSIS — K8050 Calculus of bile duct without cholangitis or cholecystitis without obstruction: Secondary | ICD-10-CM | POA: Diagnosis not present

## 2023-01-23 DIAGNOSIS — R7881 Bacteremia: Secondary | ICD-10-CM | POA: Diagnosis not present

## 2023-01-23 DIAGNOSIS — Z9049 Acquired absence of other specified parts of digestive tract: Secondary | ICD-10-CM | POA: Diagnosis not present

## 2023-01-23 DIAGNOSIS — K8309 Other cholangitis: Secondary | ICD-10-CM | POA: Diagnosis not present

## 2023-01-23 LAB — CULTURE, BLOOD (ROUTINE X 2)
Special Requests: ADEQUATE
Special Requests: ADEQUATE

## 2023-01-23 LAB — PROCALCITONIN: Procalcitonin: 2.43 ng/mL

## 2023-01-23 MED ORDER — ONDANSETRON HCL 4 MG PO TABS: 4.0000 mg | ORAL_TABLET | ORAL | 0 refills | Status: AC | PRN

## 2023-01-23 MED ORDER — POLYETHYLENE GLYCOL 3350 17 G PO PACK: 17.0000 g | PACK | Freq: Every day | ORAL | 0 refills | Status: AC

## 2023-01-23 MED ORDER — CIPROFLOXACIN HCL 500 MG PO TABS: 500.0000 mg | ORAL_TABLET | Freq: Two times a day (BID) | ORAL | 0 refills | Status: AC

## 2023-01-23 MED ORDER — HYDROCODONE-ACETAMINOPHEN 5-325 MG PO TABS: 1.0000 | ORAL_TABLET | ORAL | 0 refills | Status: AC | PRN

## 2023-01-23 MED ORDER — BISACODYL 10 MG RE SUPP
10.0000 mg | Freq: Once | RECTAL | Status: AC
  Administered 2023-01-23: 10 mg via RECTAL
  Filled 2023-01-23: qty 1

## 2023-01-23 MED ORDER — METRONIDAZOLE 500 MG PO TABS: 500.0000 mg | ORAL_TABLET | Freq: Two times a day (BID) | ORAL | 0 refills | Status: AC

## 2023-01-23 NOTE — Progress Notes (Signed)
Discharge instructions reviewed with patient including followup visits and new medications.  Understanding was verbalized and all questions were answered.  IV removed without complication; patient tolerated well.  Patient discharged home via wheelchair in stable condition escorted by volunteer staff.

## 2023-01-23 NOTE — Progress Notes (Signed)
Physical Therapy Treatment Patient Details Name: Maria Manning MRN: [id number] DOB: 01/08/1941 Today's Date: 01/23/2023   History of Present Illness Maria Manning is an 20yoF who comes to [facility] on 1/21 c ABD pain and digestive distress, weeks of reported postprandial pain. Pt went for ERCP on 01/20/23 for bile duct stone. PMH: HTN, PE, recurrent choledocholithiasis, cholecystectomy, Rt hip DJD, Rt sciatica.    PT Comments    Pt seen for PT tx with pt's daughter present in room, pt agreeable to tx. Pt endorses upper abdominal pain, SOB & feeling as if she's "wheezing" in her chest but SpO2 94% on room air after gait. Pt making progress with functional mobility as she's able to ambulate into hallway with RW & supervision with cuing for safe use of AD. Pt is limited by pain & fatigue. Continue to recommend HHPT f/u to maximize independence with mobility, reduce fall risk, and help facilitate safe return to PLOF.    Recommendations for follow up therapy are one component of a multi-disciplinary discharge planning process, led by the attending physician.  Recommendations may be updated based on patient status, additional functional criteria and insurance authorization.  Follow Up Recommendations  Home health PT     Assistance Recommended at Discharge Intermittent Supervision/Assistance  Patient can return home with the following A little help with walking and/or transfers;A little help with bathing/dressing/bathroom;Assistance with cooking/housework;Assist for transportation;Help with stairs or ramp for entrance   Equipment Recommendations  None recommended by PT    Recommendations for Other Services       Precautions / Restrictions Precautions Precautions: Fall Restrictions Weight Bearing Restrictions: No     Mobility  Bed Mobility Overal bed mobility: Modified Independent Bed Mobility: Supine to Sit     Supine to sit: Modified independent (Device/Increase time), HOB  elevated          Transfers Overall transfer level: Needs assistance Equipment used: Rolling walker (2 wheels) Transfers: Sit to/from Stand Sit to Stand: Supervision           General transfer comment: cuing: re safe hand placement    Ambulation/Gait Ambulation/Gait assistance: Supervision Gait Distance (Feet): 70 Feet Assistive device: Rolling walker (2 wheels) Gait Pattern/deviations: Decreased step length - right, Decreased stride length, Decreased step length - left Gait velocity: decreased     General Gait Details: cuing to ambulate within base of AD vs pushing it slightly out in front   Stairs             Wheelchair Mobility    Modified Rankin (Stroke Patients Only)       Balance Overall balance assessment: Needs assistance Sitting-balance support: Feet supported Sitting balance-Leahy Scale: Fair     Standing balance support: Bilateral upper extremity supported, During functional activity Standing balance-Leahy Scale: Fair                              Cognition Arousal/Alertness: Awake/alert Behavior During Therapy: WFL for tasks assessed/performed Overall Cognitive Status: Within Functional Limits for tasks assessed                                          Exercises      General Comments General comments (skin integrity, edema, etc.): SpO2 94% on room air after gait, pt c/o feeling like she's "wheezing" in her chest - nurse notified  Pertinent Vitals/Pain Pain Assessment Pain Assessment: Faces Faces Pain Scale: Hurts little more Pain Location: upper abdominal Pain Descriptors / Indicators: Discomfort Pain Intervention(s): Monitored during session    Home Living                          Prior Function            PT Goals (current goals can now be found in the care plan section) Acute Rehab PT Goals Patient Stated Goal: imrpoved pain control PT Goal Formulation: With patient Time  For Goal Achievement: 02/04/23 Potential to Achieve Goals: Good Progress towards PT goals: Progressing toward goals    Frequency    Min 2X/week      PT Plan Current plan remains appropriate    Co-evaluation              AM-PAC PT "6 Clicks" Mobility   Outcome Measure  Help needed turning from your back to your side while in a flat bed without using bedrails?: A Little Help needed moving from lying on your back to sitting on the side of a flat bed without using bedrails?: A Little Help needed moving to and from a bed to a chair (including a wheelchair)?: A Little Help needed standing up from a chair using your arms (e.g., wheelchair or bedside chair)?: A Little Help needed to walk in hospital room?: A Little Help needed climbing 3-5 steps with a railing? : A Little 6 Click Score: 18    End of Session   Activity Tolerance: Patient limited by fatigue;Patient limited by pain Patient left: in bed;with call bell/phone within reach;with family/visitor present Nurse Communication: Mobility status (c/o feeling SOB/"wheezing" in her chest, O2 levels) PT Visit Diagnosis: Difficulty in walking, not elsewhere classified (R26.2);Other abnormalities of gait and mobility (R26.89);Muscle weakness (generalized) (M62.81);Pain Pain - part of body:  (abdomen)     Time: 4332-9518 PT Time Calculation (min) (ACUTE ONLY): 9 min  Charges:  $Therapeutic Activity: 8-22 mins                     Maria Manning, PT, DPT 01/23/23, 1:47 PM   Waunita Schooner 01/23/2023, 1:46 PM

## 2023-01-23 NOTE — Discharge Summary (Signed)
Physician Discharge Summary   Patient: Maria Manning MRN: [id number] DOB: 1941/04/04  Admit date:     01/18/2023  Discharge date: 01/23/23  Discharge Physician: Lorella Nimrod   PCP: Rusty Aus, MD   Recommendations at discharge:  Please obtain CBC and CMP in 1 week Patient is being discharged on 1 week of ciprofloxacin and Flagyl-please ensure she completed a course. Follow-up with gastroenterology in 1 to 2 weeks Follow-up with primary care provider  Discharge Diagnoses: Principal Problem:   Choledocholithiasis, recurrent Active Problems:   Bacteremia due to Klebsiella pneumoniae   History of cholecystectomy   Ascending cholangitis   Atrial flutter (HCC)   History of pulmonary embolism   Hypertensive urgency   Hospital Course: Taken from H&P.  Maria Manning is a 82 y.o. 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  Most of the history is given by her daughter at the bedside who states that patient started having intermittent postprandial pain over the last few weeks but on the night of arrival it was very severe and she asked them to call EMS.   ED course and data review: Afebrile, tachypneic to 26 with otherwise unremarkable vitals.  WBC 10,700.  AST 177, ALT 178, alk phos 2021 total bilirubin 1.3.  Lipase 55.EKG, personally viewed and interpreted showing atrial flutter at 68 with no concerning ST-T wave changes. MRCP showing a 14 mm calculus within the distal common duct at the ampulla among other findings as outlined below: IMPRESSION: 1. Moderate intra and extrahepatic biliary ductal dilation secondary to a 14 mm calculus within the distal common duct at the ampulla. 2.  Several simple appearing cysts seen scattered within the head and uncinate process of the pancreas as well as the distal body of the pancreas which demonstrate no significant restricted diffusion or enhancement and are most in keeping with multiple small dilated pancreatic side branches. 3. A single 8 mm cyst within the neck of the pancreas demonstrates restricted diffusion without associated abnormal enhancement. 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 No aggressive features are identified though the examination is limited by motion artifact. This could be further assessed with EUS/FNA if clinically indicated. 4. Small hiatal hernia.  GI was consulted for ERCP.  1/22: Vital stable.  Worsening leukocytosis and procalcitonin at 2.27.  Starting her on ceftriaxone and Flagyl for concern of intra-abdominal infection.  Going for ERCP tomorrow morning. UA with moderate leukocytosis-cultures pending.  No urinary symptoms  1/23: Vitals with mildly elevated blood pressure at 157/74.  Labs with worsening hepatic function, worsening leukocytosis.  Blood cultures with Klebsiella pneumonia.  Urine cultures with insignificant growth. Patient had successful ERCP with sphincterotomy and sweeping of bile stone.  Tolerated the procedure well.  1/24: Hemodynamically stable with mildly elevated blood pressure at 167/75.  Patient continued to have significant pain.  Labs with improving hepatic function.  Slight worsening of hyponatremia to 131.  CBC with some improvement of leukocytosis to 19.2, hemoglobin at 11.5.  Slight worsening of procalcitonin to 5.  Blood cultures with second organism called Aeromonas along with Klebsiella pneumonia.  Repeating blood cultures.  1/25: Vitals with elevated blood pressure.  Restarting home losartan and metoprolol.  Procalcitonin and leukocytosis improving.  Repeat blood cultures remain  negative in 24-hour. Klebsiella pneumonia susceptibilities are still pending.  Most likely she will be discharged  on ciprofloxacin to complete a total of 2 weeks of antibiotics.  1/26: Hemodynamically stable.  Procalcitonin continued to trend down.  Repeat blood cultures remain negative in 2 days.  Able to tolerate diet with improvement in pain. Prior blood culture with 3 different bacteria, she is being discharged on ciprofloxacin and Flagyl for 7 more days to complete a 10-day course after discussing with infectious disease.  She was also given Norco to use only as needed for severe pain if Tylenol does not help.  She will continue the rest of her home medications and need to have a close follow-up with gastroenterologist and primary care provider for further recommendations.   Assessment and Plan: * Choledocholithiasis, recurrent History of cholecystectomy.  Procalcitonin elevated at 2.27>>5 and patient continued to have significant abdominal pain MRCP showing stone at the ampulla. Starting her on ceftriaxone and Flagyl for concern of intra-abdominal infection. S/p ERCP with sphincterotomy and removal of bile stone. -Continue with pain management -Continue with antibiotics -Continue with supportive care -Advance diet as tolerated  Bacteremia due to Klebsiella pneumoniae Blood cultures positive for Klebsiella pneumonia and Aeromonas.  Urine cultures with insignificant growth-pending final susceptibility -Continue with ceftriaxone and Flagyl. -Repeat blood cultures-remain negative in 24-hour -Klebsiella susceptibilities still pending  Ascending cholangitis Might be the cause of Klebsiella pneumonia bacteremia. -Continue with ceftriaxone and Flagyl  Atrial flutter (HCC) Resolved. Appears new onset on EKG with no prior history of same. Cardiology saw the monitor and EKG and does not think that it is atrial flutter or fibrillation.  They signed off stating that we can reconsult if  needed  Hypertensive urgency Resolved.  Blood pressure improved. -Continue with home amlodipine. -Continue to monitor  History of pulmonary embolism Not currently on anticoagulation   Pain control - [facility] Controlled Substance Reporting System database was reviewed. and patient was instructed, not to drive, operate heavy machinery, perform activities at heights, swimming or participation in water activities or provide baby-sitting services while on Pain, Sleep and Anxiety Medications; until their outpatient Physician has advised to do so again. Also recommended to not to take more than prescribed Pain, Sleep and Anxiety Medications.  Consultants: Gastroenterology Procedures performed: ERCP Disposition: Home Diet recommendation:  Discharge Diet Orders (From admission, onward)     Start     Ordered   01/23/23 0000  Diet - low sodium heart healthy        01/23/23 1323           Cardiac diet DISCHARGE MEDICATION: Allergies as of 01/23/2023       Reactions   Ace Inhibitors Cough   Codeine Nausea Only, Nausea And Vomiting   Elemental Sulfur Other (See Comments)   Lansoprazole Other (See Comments)   Morphine And Related Nausea And Vomiting   Penicillins    Ranitidine    Ranitidine Hcl Other (See Comments)   "felt weak and jittery"   Telmisartan Other (See Comments)   Adverse effects   Penicillin V Potassium Rash        Medication List     TAKE these medications    acetaminophen 500 MG tablet Commonly known as: TYLENOL Take 500-1,000 mg by mouth every 6 (six) hours as needed for mild pain, moderate pain or fever.   ALPRAZolam 0.25 MG tablet Commonly known as: XANAX Take 0.25 mg by mouth at  bedtime as needed for sleep.   amLODipine 5 MG tablet Commonly known as: NORVASC Take 5 mg by mouth daily.   aspirin 81 MG tablet Take 81 mg by mouth daily.   Calcium 600-D 600-400 MG-UNIT Tabs Generic drug: Calcium Carbonate-Vitamin D3 Take 1 tablet by mouth 2  (two) times daily.   ciprofloxacin 500 MG tablet Commonly known as: CIPRO Take 1 tablet (500 mg total) by mouth 2 (two) times daily for 7 days.   HYDROcodone-acetaminophen 5-325 MG tablet Commonly known as: NORCO/VICODIN Take 1 tablet by mouth every 4 (four) hours as needed for moderate pain.   losartan 100 MG tablet Commonly known as: COZAAR Take 100 mg by mouth daily.   metoprolol tartrate 25 MG tablet Commonly known as: LOPRESSOR Take 25 mg by mouth 2 (two) times daily.   metroNIDAZOLE 500 MG tablet Commonly known as: Flagyl Take 1 tablet (500 mg total) by mouth 2 (two) times daily for 7 days.   multivitamin tablet Take 1 tablet by mouth daily.   Omeprazole 20 MG Tbec Take 20 mg by mouth daily.   ondansetron 4 MG tablet Commonly known as: ZOFRAN Take 1 tablet (4 mg total) by mouth every 4 (four) hours as needed for nausea.   polyethylene glycol 17 g packet Commonly known as: MIRALAX / GLYCOLAX Take 17 g by mouth daily. Start taking on: January 24, 2023        Follow-up Information     Rusty Aus, MD. Schedule an appointment as soon as possible for a visit in 1 week(s).   Specialty: Internal Medicine Contact information: [place] [address] [phone]         Lucilla Lame, MD Follow up in 2 week(s).   Specialty: Gastroenterology Contact information: [place] [address] [phone]                Discharge Exam: Danley Danker Weights   01/18/23 1958 01/20/23 1008  Weight: 98 kg 98 kg   General.  Obese elderly lady, in no acute distress. Pulmonary.  Lungs clear bilaterally, normal respiratory effort. CV.  Regular rate and rhythm, no JVD, rub or murmur. Abdomen.  Soft, nontender, nondistended, BS positive. CNS.  Alert and oriented .  No focal neurologic deficit. Extremities.  No edema, no cyanosis, pulses intact and symmetrical. Psychiatry.  Judgment and insight  appears normal.   Condition at discharge: stable  The results of significant diagnostics from this hospitalization (including imaging, microbiology, ancillary and laboratory) are listed below for reference.   Imaging Studies: DG C-Arm 1-60 Min-No Report  Result Date: 01/20/2023 Fluoroscopy was utilized by the requesting physician.  No radiographic interpretation.   MR ABDOMEN MRCP W WO CONTAST  Result Date: 01/19/2023 CLINICAL DATA:  Right upper quadrant abdominal pain EXAM: MRI ABDOMEN WITHOUT AND WITH CONTRAST (INCLUDING MRCP) TECHNIQUE: Multiplanar multisequence MR imaging of the abdomen was performed both before and after the administration of intravenous contrast. Heavily T2-weighted images of the biliary and pancreatic ducts were obtained, and three-dimensional MRCP images were rendered by post processing. CONTRAST:  27m GADAVIST GADOBUTROL 1 MMOL/ML IV SOLN COMPARISON:  CT 01/19/2023 FINDINGS: Lower chest: No acute findings.  Small hiatal hernia. Hepatobiliary: No focal intrahepatic mass identified. Normal hepatic parenchymal signal intensity. Moderate intra and extrahepatic biliary ductal dilation with the extrahepatic bile duct measuring up to 16 mm in diameter. There is a intraluminal filling defect compatible with a calculus measuring up to 14 mm  in diameter within the distal common duct at the ampulla. Status post cholecystectomy. Pancreas: No enhancing intrapancreatic mass identified. No pancreatic ductal dilation. No peripancreatic inflammatory changes seen. Several simple cysts are seen scattered within the head and uncinate process of the pancreas as well as the distal body of the pancreas which demonstrate no significant restricted diffusion or enhancement and are most in keeping with multiple small dilated pancreatic side branches. A single 8 mm cyst is identified, however, within the neck of the pancreas at image # 19/9 and demonstrates restricted diffusion, best appreciated on image  # 19/10. 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 No associated abnormal enhancement. Spleen:  Within normal limits in size and appearance. Adrenals/Urinary Tract: The adrenal glands are unremarkable. Simple cortical cysts are seen within the kidneys bilaterally. No follow-up imaging is recommended. Mild bilateral nonspecific perinephric stranding. The kidneys are otherwise unremarkable. No hydronephrosis. Stomach/Bowel: Visualized portions within the abdomen are unremarkable. Vascular/Lymphatic: No pathologically enlarged lymph nodes identified. No abdominal aortic aneurysm demonstrated. Other:  None. Musculoskeletal: No suspicious bone lesions identified. IMPRESSION: 1. Moderate intra and extrahepatic biliary ductal dilation secondary to a 14 mm calculus within the distal common duct at the ampulla. 2. Several simple appearing cysts seen scattered within the head and uncinate process of the pancreas as well as the distal body of the pancreas which demonstrate no significant restricted diffusion or enhancement and are most in keeping with multiple small dilated pancreatic side branches. 3. A single 8 mm cyst within the neck of the pancreas demonstrates restricted diffusion without associated abnormal enhancement. 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 No aggressive features are identified though the examination is limited by motion artifact. This could be further assessed with EUS/FNA if clinically indicated. 4. Small hiatal hernia. Electronically Signed   By: Fidela Salisbury M.D.   On: 01/19/2023 04:28   MR 3D Recon At Scanner  Result Date: 01/19/2023 CLINICAL DATA:  Right upper quadrant abdominal pain EXAM: MRI ABDOMEN WITHOUT AND WITH CONTRAST (INCLUDING MRCP) TECHNIQUE: Multiplanar multisequence MR imaging of the abdomen was performed both  before and after the administration of intravenous contrast. Heavily T2-weighted images of the biliary and pancreatic ducts were obtained, and three-dimensional MRCP images were rendered by post processing. CONTRAST:  66m GADAVIST GADOBUTROL 1 MMOL/ML IV SOLN COMPARISON:  CT 01/19/2023 FINDINGS: Lower chest: No acute findings.  Small hiatal hernia. Hepatobiliary: No focal intrahepatic mass identified. Normal hepatic parenchymal signal intensity. Moderate intra and extrahepatic biliary ductal dilation with the extrahepatic bile duct measuring up to 16 mm in diameter. There is a intraluminal filling defect compatible with a calculus measuring up to 14 mm in diameter within the distal common duct at the ampulla. Status post cholecystectomy. Pancreas: No enhancing intrapancreatic mass identified. No pancreatic ductal dilation. No peripancreatic inflammatory changes seen. Several simple cysts are seen scattered within the head and uncinate process of the pancreas as well as the distal body of the pancreas which demonstrate no significant restricted diffusion or enhancement and are most in keeping with multiple small dilated pancreatic side branches. A single 8 mm cyst is identified, however, within the neck of the pancreas at image # 19/9 and demonstrates restricted diffusion, best appreciated on image # 19/10. 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 No associated abnormal enhancement. Spleen:  Within normal limits  in size and appearance. Adrenals/Urinary Tract: The adrenal glands are unremarkable. Simple cortical cysts are seen within the kidneys bilaterally. No follow-up imaging is recommended. Mild bilateral nonspecific perinephric stranding. The kidneys are otherwise unremarkable. No hydronephrosis. Stomach/Bowel: Visualized portions within the abdomen are unremarkable. Vascular/Lymphatic: No pathologically enlarged lymph nodes  identified. No abdominal aortic aneurysm demonstrated. Other:  None. Musculoskeletal: No suspicious bone lesions identified. IMPRESSION: 1. Moderate intra and extrahepatic biliary ductal dilation secondary to a 14 mm calculus within the distal common duct at the ampulla. 2. Several simple appearing cysts seen scattered within the head and uncinate process of the pancreas as well as the distal body of the pancreas which demonstrate no significant restricted diffusion or enhancement and are most in keeping with multiple small dilated pancreatic side branches. 3. A single 8 mm cyst within the neck of the pancreas demonstrates restricted diffusion without associated abnormal enhancement. 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 No aggressive features are identified though the examination is limited by motion artifact. This could be further assessed with EUS/FNA if clinically indicated. 4. Small hiatal hernia. Electronically Signed   By: Fidela Salisbury M.D.   On: 01/19/2023 04:28   CT ABDOMEN PELVIS W CONTRAST  Result Date: 01/19/2023 CLINICAL DATA:  Right lower quadrant abdominal pain. EXAM: CT ABDOMEN AND PELVIS WITH CONTRAST TECHNIQUE: Multidetector CT imaging of the abdomen and pelvis was performed using the standard protocol following bolus administration of intravenous contrast. RADIATION DOSE REDUCTION: This exam was performed according to the departmental dose-optimization program which includes automated exposure control, adjustment of the mA and/or kV according to patient size and/or use of iterative reconstruction technique. CONTRAST:  147m OMNIPAQUE IOHEXOL 300 MG/ML  SOLN COMPARISON:  CT of the abdomen pelvis dated 10/23/2016. FINDINGS: Lower chest: Left lung base linear atelectasis/scarring. The visualized lung bases are otherwise clear. There is coronary vascular calcification. No intra-abdominal free air or free fluid.  Hepatobiliary: The liver is unremarkable. Cholecystectomy. There is dilatation of the intrahepatic and extrahepatic biliary tree. The common bile duct measures approximately 19 mm in diameter. A 13 mm nodular density in the central CBD at the head of the pancreas (coronal 59/603) suspicious for a stone. Further evaluation with MRCP is recommended. Pancreas: Several small cystic pancreatic lesions measuring up to 11 mm in the uncinate process of the pancreas. 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 Attention on MRI recommended. No active inflammatory changes. Spleen: Normal in size without focal abnormality. Adrenals/Urinary Tract: The adrenal glands are unremarkable. There is a 12 mm left renal inferior pole cyst. There is no hydronephrosis on either side. There is symmetric enhancement and excretion of contrast by both kidneys. The visualized ureters and urinary bladder appear unremarkable. Stomach/Bowel: There is a small hiatal hernia. There is no bowel obstruction or active inflammation. The appendix is not visualized with certainty. No inflammatory changes identified in the right lower quadrant. Vascular/Lymphatic: Moderate aortoiliac atherosclerotic disease. The IVC is unremarkable. No portal venous gas. There is no adenopathy. Reproductive: Hysterectomy.  No adnexal masses. Other: None Musculoskeletal: Osteopenia with degenerative changes of the spine. No acute osseous pathology. IMPRESSION: 1. No bowel obstruction. 2. Dilatation of the biliary tree likely related to obstruction of the central CBD at the head of the pancreas. Further evaluation with MRCP is recommended. 3.  Aortic Atherosclerosis (ICD10-I70.0). Electronically Signed   By: AAnner CreteM.D.   On: 01/19/2023 00:54  Microbiology: Results for orders placed or performed during the hospital encounter of 01/18/23  Urine Culture     Status: Abnormal   Collection Time: 01/19/23 12:24 AM   Specimen:  Urine, Clean Catch  Result Value Ref Range Status   Specimen Description   Final    URINE, CLEAN CATCH Performed at [facility], [address] [place], [place] [address]    Special Requests   Final    NONE Performed at [facility], [address] [place], [place] [address]    Culture (A)  Final    <10,000 COLONIES/mL INSIGNIFICANT GROWTH Performed at [facility], [place] [address], [place], [place] [address]    Report Status 01/20/2023 FINAL  Final  Blood culture (routine x 2)     Status: Abnormal   Collection Time: 01/19/23  5:42 AM   Specimen: BLOOD  Result Value Ref Range Status   Specimen Description   Final    BLOOD BLOOD RIGHT FOREARM Performed at [facility], [address] [place], [place] [address]    Special Requests   Final    BOTTLES DRAWN AEROBIC AND ANAEROBIC Blood Culture adequate volume Performed at [facility], [address] [place], [place] [address]    Culture  Setup Time   Final    GRAM NEGATIVE RODS IN BOTH AEROBIC AND ANAEROBIC BOTTLES CRITICAL VALUE NOTED.  VALUE IS CONSISTENT WITH PREVIOUSLY REPORTED AND CALLED VALUE. Performed at [facility], [address] [place], [place] [address]    Culture (A)  Final    AEROMONAS CAVIAE KLEBSIELLA PNEUMONIAE SUSCEPTIBILITIES PERFORMED ON PREVIOUS CULTURE WITHIN THE LAST 5 DAYS. Performed at [facility], [place] [address], [place], [place] [address]    Report Status 01/23/2023 FINAL  Final  Blood culture (routine x 2)     Status: Abnormal   Collection Time: 01/19/23  5:42 AM   Specimen: BLOOD  Result Value Ref Range Status   Specimen Description   Final    BLOOD LEFT ANTECUBITAL Performed at [facility], [address] [place], [place] [address]    Special Requests   Final    BOTTLES DRAWN AEROBIC AND ANAEROBIC Blood Culture adequate volume Performed at [facility], [address], [place], [place] [address]     Culture  Setup Time   Final    GRAM NEGATIVE RODS IN BOTH AEROBIC AND ANAEROBIC BOTTLES CRITICAL RESULT CALLED TO, READ BACK BY AND VERIFIED WITH: JUSTIN MILLER  AT 1640 01/19/23 SS/RP Performed at [facility], [place] [address] [place], [place] [address]    Culture (A)  Final    AEROMONAS CAVIAE KLEBSIELLA PNEUMONIAE KLEBSIELLA OXYTOCA    Report Status 01/23/2023 FINAL  Final   Organism ID, Bacteria AEROMONAS CAVIAE  Final   Organism ID, Bacteria KLEBSIELLA PNEUMONIAE  Final   Organism ID, Bacteria KLEBSIELLA OXYTOCA  Final      Susceptibility   Aeromonas caviae - MIC*    CEFAZOLIN 32 INTERMEDIATE Intermediate     CEFTAZIDIME <=1 SENSITIVE Sensitive     CIPROFLOXACIN <=0.25 SENSITIVE Sensitive     GENTAMICIN <=1 SENSITIVE Sensitive     IMIPENEM 0.5 SENSITIVE Sensitive     TRIMETH/SULFA <=20 SENSITIVE Sensitive     * AEROMONAS CAVIAE   Klebsiella oxytoca - MIC*    AMPICILLIN RESISTANT Resistant     CEFAZOLIN <=4 SENSITIVE Sensitive     CEFEPIME <=0.12 SENSITIVE Sensitive     CEFTAZIDIME <=1  SENSITIVE Sensitive     CEFTRIAXONE <=0.25 SENSITIVE Sensitive     CIPROFLOXACIN <=0.25 SENSITIVE Sensitive     GENTAMICIN <=1 SENSITIVE Sensitive     IMIPENEM 0.5 SENSITIVE Sensitive     TRIMETH/SULFA <=20 SENSITIVE Sensitive     AMPICILLIN/SULBACTAM 4 SENSITIVE Sensitive     PIP/TAZO <=4 SENSITIVE Sensitive     * KLEBSIELLA OXYTOCA   Klebsiella pneumoniae - MIC*    AMPICILLIN RESISTANT Resistant     CEFAZOLIN <=4 SENSITIVE Sensitive     CEFEPIME <=0.12 SENSITIVE Sensitive     CEFTAZIDIME <=1 SENSITIVE Sensitive     CEFTRIAXONE <=0.25 SENSITIVE Sensitive     CIPROFLOXACIN <=0.25 SENSITIVE Sensitive     GENTAMICIN <=1 SENSITIVE Sensitive     IMIPENEM <=0.25 SENSITIVE Sensitive     TRIMETH/SULFA <=20 SENSITIVE Sensitive     AMPICILLIN/SULBACTAM <=2 SENSITIVE Sensitive     PIP/TAZO <=4 SENSITIVE Sensitive     * KLEBSIELLA PNEUMONIAE  Blood Culture ID Panel (Reflexed)      Status: Abnormal   Collection Time: 01/19/23  5:42 AM  Result Value Ref Range Status   Enterococcus faecalis NOT DETECTED NOT DETECTED Final   Enterococcus Faecium NOT DETECTED NOT DETECTED Final   Listeria monocytogenes NOT DETECTED NOT DETECTED Final   Staphylococcus species NOT DETECTED NOT DETECTED Final   Staphylococcus aureus (BCID) NOT DETECTED NOT DETECTED Final   Staphylococcus epidermidis NOT DETECTED NOT DETECTED Final   Staphylococcus lugdunensis NOT DETECTED NOT DETECTED Final   Streptococcus species NOT DETECTED NOT DETECTED Final   Streptococcus agalactiae NOT DETECTED NOT DETECTED Final   Streptococcus pneumoniae NOT DETECTED NOT DETECTED Final   Streptococcus pyogenes NOT DETECTED NOT DETECTED Final   A.calcoaceticus-baumannii NOT DETECTED NOT DETECTED Final   Bacteroides fragilis NOT DETECTED NOT DETECTED Final   Enterobacterales DETECTED (A) NOT DETECTED Final    Comment: Enterobacterales represent a large order of gram negative bacteria, not a single organism. CRITICAL RESULT CALLED TO, READ BACK BY AND VERIFIED WITH: JUSTIN MILLER AT 1640 ON 01/19/23 SS/RP    Enterobacter cloacae complex NOT DETECTED NOT DETECTED Final   Escherichia coli NOT DETECTED NOT DETECTED Final   Klebsiella aerogenes NOT DETECTED NOT DETECTED Final   Klebsiella oxytoca NOT DETECTED NOT DETECTED Final   Klebsiella pneumoniae DETECTED (A) NOT DETECTED Final    Comment: CRITICAL RESULT CALLED TO, READ BACK BY AND VERIFIED WITH: JUSTIN MILLER  AT 1640 ON 01/19/23 SS/RP    Proteus species NOT DETECTED NOT DETECTED Final   Salmonella species NOT DETECTED NOT DETECTED Final   Serratia marcescens NOT DETECTED NOT DETECTED Final   Haemophilus influenzae NOT DETECTED NOT DETECTED Final   Neisseria meningitidis NOT DETECTED NOT DETECTED Final   Pseudomonas aeruginosa NOT DETECTED NOT DETECTED Final   Stenotrophomonas maltophilia NOT DETECTED NOT DETECTED Final   Candida albicans NOT DETECTED NOT  DETECTED Final   Candida auris NOT DETECTED NOT DETECTED Final   Candida glabrata NOT DETECTED NOT DETECTED Final   Candida krusei NOT DETECTED NOT DETECTED Final   Candida parapsilosis NOT DETECTED NOT DETECTED Final   Candida tropicalis NOT DETECTED NOT DETECTED Final   Cryptococcus neoformans/gattii NOT DETECTED NOT DETECTED Final   CTX-M ESBL NOT DETECTED NOT DETECTED Final   Carbapenem resistance IMP NOT DETECTED NOT DETECTED Final   Carbapenem resistance KPC NOT DETECTED NOT DETECTED Final   Carbapenem resistance NDM NOT DETECTED NOT DETECTED Final   Carbapenem resist OXA 48 LIKE NOT DETECTED NOT DETECTED Final  Carbapenem resistance VIM NOT DETECTED NOT DETECTED Final    Comment: Performed at [facility], [address] [place], [place] [address]  Culture, blood (Routine X 2) w Reflex to ID Panel     Status: None (Preliminary result)   Collection Time: 01/21/23  2:48 PM   Specimen: BLOOD  Result Value Ref Range Status   Specimen Description BLOOD BLOOD RIGHT HAND  Final   Special Requests   Final    BOTTLES DRAWN AEROBIC AND ANAEROBIC Blood Culture adequate volume   Culture   Final    NO GROWTH 2 DAYS Performed at [facility], [address] [place], [place] [address]    Report Status PENDING  Incomplete  Culture, blood (Routine X 2) w Reflex to ID Panel     Status: None (Preliminary result)   Collection Time: 01/21/23  2:52 PM   Specimen: BLOOD  Result Value Ref Range Status   Specimen Description BLOOD BLOOD LEFT WRIST  Final   Special Requests   Final    BOTTLES DRAWN AEROBIC AND ANAEROBIC Blood Culture adequate volume   Culture   Final    NO GROWTH 2 DAYS Performed at [facility], [address] [place], [place] [address]    Report Status PENDING  Incomplete    Labs: CBC: Recent Labs  Lab 01/18/23 2002 01/20/23 0532 01/21/23 0609 01/22/23 0536  WBC 10.7* 25.8* 19.2* 12.6*  HGB 13.0 12.7 11.5* 11.8*  HCT 38.0 37.2 33.4*  34.2*  MCV 89.6 89.4 88.6 87.5  PLT 303 216 196 532   Basic Metabolic Panel: Recent Labs  Lab 01/18/23 2002 01/20/23 0532 01/21/23 0609  NA 135 132* 131*  K 3.3* 3.7 3.5  CL 103 100 99  CO2 20* 20* 25  GLUCOSE 180* 111* 102*  BUN '11 19 15  '$ CREATININE 0.65 0.72 0.59  CALCIUM 8.8* 7.8* 7.9*   Liver Function Tests: Recent Labs  Lab 01/18/23 2002 01/20/23 0532 01/21/23 0609  AST 177* 273* 82*  ALT 178* 405* 216*  ALKPHOS 202* 185* 143*  BILITOT 1.3* 2.7* 1.8*  PROT 7.3 6.3* 5.8*  ALBUMIN 3.8 3.0* 2.5*   CBG: No results for input(s): "GLUCAP" in the last 168 hours.  Discharge time spent: greater than 30 minutes.  This record has been created using Systems analyst. Errors have been sought and corrected,but may not always be located. Such creation errors do not reflect on the standard of care.   Signed: Lorella Nimrod, MD [facility] 01/23/2023

## 2023-01-23 NOTE — Progress Notes (Signed)
Patient with loss of IV access.  Unable to give IV dose of flagyl due to loss of IV access.  Dr. Reesa Chew notified. Will discharge home and continue with oral antibiotics.

## 2023-01-23 NOTE — Consult Note (Signed)
[place] for Infectious Disease  Total days of antibiotics 5         Reason for Consult:gram negative bacteremia   Referring Physician: amin  Principal Problem:   Choledocholithiasis, recurrent Active Problems:   History of pulmonary embolism   History of cholecystectomy   Hypertensive urgency   Atrial flutter (HCC)   Ascending cholangitis   Bacteremia due to Klebsiella pneumoniae    HPI: Maria Manning is a 82 y.o. female hx of HTN, GERD,HLD, a flutter, admitted for recurrent choledocolithiasis. She had episode in 2018 with ERCP and biliary sphincterotomy, then had cholecystecotmy. On the night of 1/21, she had episode of RUQ pain, severe with vomiting and diarrhea. She was found to have leukocytosis of 11K, transaminitis and elevated ALP. Lipase 55. Imaging showed 96m stone in distal common duct at ampulla, but also simple cysts in body of pancreas. Her presentation concerning for ascending cholangitis. She was started on empiric abtx. Her blood cx grew 3 different GNR. Due to kleb, she was changed to ceftaz and metronidazole. She underwent ERCP on 1/23, had biliary sphincterotomy, and balloon sweep to remove debris/stones/obstruction. She is feeling better but does have gas.   Past Medical History:  Diagnosis Date   Elevated lipids    GERD (gastroesophageal reflux disease)    Hypertension    PE (pulmonary embolism)     Allergies:  Allergies  Allergen Reactions   Ace Inhibitors Cough   Codeine Nausea Only and Nausea And Vomiting   Elemental Sulfur Other (See Comments)   Lansoprazole Other (See Comments)   Morphine And Related Nausea And Vomiting   Penicillins    Ranitidine    Ranitidine Hcl Other (See Comments)    "felt weak and jittery"   Telmisartan Other (See Comments)    Adverse effects   Penicillin V Potassium Rash    Current antibiotics: ceftaz and metronidazole   MEDICATIONS:  amLODipine  10 mg Oral Daily   enoxaparin (LOVENOX) injection  0.5  mg/kg Subcutaneous Q24H   losartan  100 mg Oral Daily   metoprolol tartrate  25 mg Oral BID   pantoprazole  40 mg Oral Daily   polyethylene glycol  17 g Oral Daily    Social History   Tobacco Use   Smoking status: Former   Smokeless tobacco: Never  VScientific laboratory technicianUse: Never used  Substance Use Topics   Alcohol use: No   Drug use: No    Family History  Problem Relation Age of Onset   Breast cancer Maternal Aunt 654  Breast cancer Mother 687   Review of Systems -   Constitutional: Negative for fever, chills, diaphoresis, activity change, appetite change, fatigue and unexpected weight change.  HENT: Negative for congestion, sore throat, rhinorrhea, sneezing, trouble swallowing and sinus pressure.  Eyes: Negative for photophobia and visual disturbance.  Respiratory: Negative for cough, chest tightness, shortness of breath, wheezing and stridor.  Cardiovascular: Negative for chest pain, palpitations and leg swelling.  Gastrointestinal: +gastric distention.Negative for nausea, vomiting, abdominal pain, diarrhea, constipation, blood in stool, abdominal distention and anal bleeding.  Genitourinary: Negative for dysuria, hematuria, flank pain and difficulty urinating.  Musculoskeletal: Negative for myalgias, back pain, joint swelling, arthralgias and gait problem.  Skin: Negative for color change, pallor, rash and wound.  Neurological: Negative for dizziness, tremors, weakness and light-headedness.  Hematological: Negative for adenopathy. Does not bruise/bleed easily.  Psychiatric/Behavioral: Negative for behavioral problems, confusion, sleep disturbance, dysphoric mood, decreased concentration  and agitation.    OBJECTIVE: Temp:  [98.2 F (36.8 C)-98.6 F (37 C)] 98.4 F (36.9 C) (01/26 0825) Pulse Rate:  [80-103] 80 (01/26 0825) Resp:  [17-20] 17 (01/26 0825) BP: (147-170)/(80-99) 153/80 (01/26 0825) SpO2:  [93 %-97 %] 95 % (01/26 0825) Physical Exam  Constitutional:   oriented to person, place, and time. appears well-developed and well-nourished. No distress.  HENT: [place]/AT, PERRLA, no scleral icterus Mouth/Throat: Oropharynx is clear and moist. No oropharyngeal exudate.  Cardiovascular: Normal rate, regular rhythm and normal heart sounds. Exam reveals no gallop and no friction rub.  No murmur heard.  Pulmonary/Chest: Effort normal and breath sounds normal. No respiratory distress.  has no wheezes.  Neck = supple, no nuchal rigidity Abdominal: Soft. Bowel sounds are normal.  exhibits no distension. There is no tenderness.  Lymphadenopathy: no cervical adenopathy. No axillary adenopathy Neurological: alert and oriented to person, place, and time.  Skin: Skin is warm and dry. No rash noted. No erythema.  Psychiatric: a normal mood and affect.  behavior is normal.    LABS: Results for orders placed or performed during the hospital encounter of 01/18/23 (from the past 48 hour(s))  Culture, blood (Routine X 2) w Reflex to ID Panel     Status: None (Preliminary result)   Collection Time: 01/21/23  2:48 PM   Specimen: BLOOD  Result Value Ref Range   Specimen Description BLOOD BLOOD RIGHT HAND    Special Requests      BOTTLES DRAWN AEROBIC AND ANAEROBIC Blood Culture adequate volume   Culture      NO GROWTH 2 DAYS Performed at [facility], [address] [place], [place] [address]    Report Status PENDING   Culture, blood (Routine X 2) w Reflex to ID Panel     Status: None (Preliminary result)   Collection Time: 01/21/23  2:52 PM   Specimen: BLOOD  Result Value Ref Range   Specimen Description BLOOD BLOOD LEFT WRIST    Special Requests      BOTTLES DRAWN AEROBIC AND ANAEROBIC Blood Culture adequate volume   Culture      NO GROWTH 2 DAYS Performed at [facility], [address], [place], [place] [address]    Report Status PENDING   Lipase, blood     Status: None   Collection Time: 01/21/23  7:35 PM  Result Value Ref Range    Lipase 33 11 - 51 U/L    Comment: Performed at [facility], [address] [place], [place] [address]  Procalcitonin     Status: None   Collection Time: 01/22/23  5:36 AM  Result Value Ref Range   Procalcitonin 3.58 ng/mL    Comment:        Interpretation: PCT > 2 ng/mL: Systemic infection (sepsis) is likely, unless other causes are known. (NOTE)       Sepsis PCT Algorithm           Lower Respiratory Tract                                      Infection PCT Algorithm    ----------------------------     ----------------------------         PCT < 0.25 ng/mL                PCT < 0.10 ng/mL  Strongly encourage             Strongly discourage   discontinuation of antibiotics    initiation of antibiotics    ----------------------------     -----------------------------       PCT 0.25 - 0.50 ng/mL            PCT 0.10 - 0.25 ng/mL               OR       >80% decrease in PCT            Discourage initiation of                                            antibiotics      Encourage discontinuation           of antibiotics    ----------------------------     -----------------------------         PCT >= 0.50 ng/mL              PCT 0.26 - 0.50 ng/mL               AND       <80% decrease in PCT              Encourage initiation of                                             antibiotics       Encourage continuation           of antibiotics    ----------------------------     -----------------------------        PCT >= 0.50 ng/mL                  PCT > 0.50 ng/mL               AND         increase in PCT                  Strongly encourage                                      initiation of antibiotics    Strongly encourage escalation           of antibiotics                                     -----------------------------                                           PCT <= 0.25 ng/mL                                                 OR                                        >  80% decrease in PCT                                      Discontinue / Do not initiate                                             antibiotics  Performed at [facility], [address] [place], [place] [address]   CBC     Status: Abnormal   Collection Time: 01/22/23  5:36 AM  Result Value Ref Range   WBC 12.6 (H) 4.0 - 10.5 K/uL   RBC 3.91 3.87 - 5.11 MIL/uL   Hemoglobin 11.8 (L) 12.0 - 15.0 g/dL   HCT 34.2 (L) 36.0 - 46.0 %   MCV 87.5 80.0 - 100.0 fL   MCH 30.2 26.0 - 34.0 pg   MCHC 34.5 30.0 - 36.0 g/dL   RDW 13.7 11.5 - 15.5 %   Platelets 210 150 - 400 K/uL   nRBC 0.0 0.0 - 0.2 %    Comment: Performed at [facility], [address] [place], [place] [address]  Procalcitonin     Status: None   Collection Time: 01/23/23  4:51 AM  Result Value Ref Range   Procalcitonin 2.43 ng/mL    Comment:        Interpretation: PCT > 2 ng/mL: Systemic infection (sepsis) is likely, unless other causes are known. (NOTE)       Sepsis PCT Algorithm           Lower Respiratory Tract                                      Infection PCT Algorithm    ----------------------------     ----------------------------         PCT < 0.25 ng/mL                PCT < 0.10 ng/mL          Strongly encourage             Strongly discourage   discontinuation of antibiotics    initiation of antibiotics    ----------------------------     -----------------------------       PCT 0.25 - 0.50 ng/mL            PCT 0.10 - 0.25 ng/mL               OR       >80% decrease in PCT            Discourage initiation of                                            antibiotics      Encourage discontinuation           of antibiotics    ----------------------------     -----------------------------         PCT >= 0.50 ng/mL              PCT 0.26 - 0.50 ng/mL  AND       <80% decrease in PCT              Encourage initiation of                                             antibiotics        Encourage continuation           of antibiotics    ----------------------------     -----------------------------        PCT >= 0.50 ng/mL                  PCT > 0.50 ng/mL               AND         increase in PCT                  Strongly encourage                                      initiation of antibiotics    Strongly encourage escalation           of antibiotics                                     -----------------------------                                           PCT <= 0.25 ng/mL                                                 OR                                        > 80% decrease in PCT                                      Discontinue / Do not initiate                                             antibiotics  Performed at [facility], [address] [place], [place] [address]     MICRO: Report Status 01/23/2023 FINAL  Organism ID, Bacteria AEROMONAS CAVIAE  Organism ID, Bacteria KLEBSIELLA PNEUMONIAE  Organism ID, Bacteria KLEBSIELLA OXYTOCA  Resulting Agency [facility]     Susceptibility   Aeromonas caviae Klebsiella pneumoniae Klebsiella oxytoca    MIC MIC MIC    AMPICILLIN   RESISTANT Resistant RESISTANT Resistant    AMPICILLIN/SULBACTAM   <=2 SENSITIVE Sensitive 4 SENSITIVE Sensitive    CEFAZOLIN 32 INTERMED... Intermediate <=4 SENSITIVE Sensitive <=4 SENSITIVE Sensitive  CEFEPIME   <=0.12 SENS... Sensitive <=0.12 SENS... Sensitive    CEFTAZIDIME <=1 SENSITIVE Sensitive <=1 SENSITIVE Sensitive <=1 SENSITIVE Sensitive    CEFTRIAXONE   <=0.25 SENS... Sensitive <=0.25 SENS... Sensitive    CIPROFLOXACIN <=0.25 SENS... Sensitive <=0.25 SENS... Sensitive <=0.25 SENS... Sensitive    GENTAMICIN <=1 SENSITIVE Sensitive <=1 SENSITIVE Sensitive <=1 SENSITIVE Sensitive    IMIPENEM 0.5 SENSITIVE Sensitive <=0.25 SENS... Sensitive 0.5 SENSITIVE Sensitive    PIP/TAZO   <=4 SENSITIVE Sensitive <=4 SENSITIVE Sensitive    TRIMETH/SULFA <=20 SENSIT...  Sensitive <=20 SENSIT... Sensitive <=20 SENSIT... Sensitive               Susceptibility Comments        IMAGING: No results found.  Assessment/Plan:  82 yo F with choledocolithiasis with cholangitis and secondary polymicrobial bacteremia (kleb oxytoca, kleb pneumo, and aeromonas) suspectibilities returned for her to transition to oral regimen. Since ERCP, patient feeling better, afebrile and leukocytosis improved  Currently on day 3 of IV Abtx since ERCP (source control) recommend to transition to orals for discharge to cipro '500mg'$  po bid and metronidazole '500mg'$  po bid x 7 days to complete 10 day  Pancreatic cyst and recurrence of choledocolithiasis = maybe helpful for her to follow up with dr Allen Norris in gi clinic. To see if needs further imaging  Leukocytosis = improved.

## 2023-01-23 NOTE — TOC Transition Note (Signed)
Transition of Care [facility]) - CM/SW Discharge Note   Patient Details  Name: Maria Manning MRN: [id number] Date of Birth: 01/03/1941  [facility] CM/SW Contact:  Candie Chroman, LCSW Phone Number: 01/23/2023, 1:25 PM   Clinical Narrative:  Patient has orders to discharge home today. No further concerns. CSW signing off.   Final next level of care: Home/Self Care Barriers to Discharge: Barriers Resolved   Patient Goals and CMS Choice      Discharge Placement                      Patient and family notified of of transfer: 01/23/23  Discharge Plan and Services Additional resources added to the After Visit Summary for                                       Social Determinants of Health (SDOH) Interventions SDOH Screenings   Food Insecurity: No Food Insecurity (01/20/2023)  Housing: Low Risk  (01/20/2023)  Transportation Needs: No Transportation Needs (01/20/2023)  Utilities: Not At Risk (01/20/2023)  Tobacco Use: Medium Risk (01/21/2023)     Readmission Risk Interventions     No data to display

## 2023-01-23 NOTE — Progress Notes (Signed)
Nursing Discharge Note   Admit Date: 01/18/2023  Discharge date: 01/23/2023   Maria Manning is to be discharged home per MD order.  AVS completed. Reviewed with patient and family at bedside. Highlighted copy provided for patient to take home.  Patient/caregiver able to verbalize understanding of discharge instructions. PIV removed. Patient stable upon discharge.   Discharge Instructions     Diet - low sodium heart healthy   Complete by: As directed    Discharge instructions   Complete by: As directed    It was pleasure taking care of you. You have been given 2 different type of antibiotics for 7 days, ciprofloxacin and Flagyl.  Please take it as directed. Flagyl sometimes can make you nauseated, you can use Zofran as needed, prescription was provided. You are also being given Norco to be used only for severe pain, try using simple Tylenol first. Please be mindful that these pain medications can cause dizziness and constipation. You can use MiraLAX as needed to prevent constipation. Keep yourself well-hydrated and continue taking rest of your home medications. Follow-up with your doctors closely for further recommendations.   Increase activity slowly   Complete by: As directed        Allergies as of 01/23/2023       Reactions   Ace Inhibitors Cough   Codeine Nausea Only, Nausea And Vomiting   Elemental Sulfur Other (See Comments)   Lansoprazole Other (See Comments)   Morphine And Related Nausea And Vomiting   Penicillins    Ranitidine    Ranitidine Hcl Other (See Comments)   "felt weak and jittery"   Telmisartan Other (See Comments)   Adverse effects   Penicillin V Potassium Rash        Medication List     TAKE these medications    acetaminophen 500 MG tablet Commonly known as: TYLENOL Take 500-1,000 mg by mouth every 6 (six) hours as needed for mild pain, moderate pain or fever.   ALPRAZolam 0.25 MG tablet Commonly known as: XANAX Take 0.25 mg by mouth at  bedtime as needed for sleep.   amLODipine 5 MG tablet Commonly known as: NORVASC Take 5 mg by mouth daily.   aspirin 81 MG tablet Take 81 mg by mouth daily.   Calcium 600-D 600-400 MG-UNIT Tabs Generic drug: Calcium Carbonate-Vitamin D3 Take 1 tablet by mouth 2 (two) times daily.   ciprofloxacin 500 MG tablet Commonly known as: CIPRO Take 1 tablet (500 mg total) by mouth 2 (two) times daily for 7 days.   HYDROcodone-acetaminophen 5-325 MG tablet Commonly known as: NORCO/VICODIN Take 1 tablet by mouth every 4 (four) hours as needed for moderate pain.   losartan 100 MG tablet Commonly known as: COZAAR Take 100 mg by mouth daily.   metoprolol tartrate 25 MG tablet Commonly known as: LOPRESSOR Take 25 mg by mouth 2 (two) times daily.   metroNIDAZOLE 500 MG tablet Commonly known as: Flagyl Take 1 tablet (500 mg total) by mouth 2 (two) times daily for 7 days.   multivitamin tablet Take 1 tablet by mouth daily.   Omeprazole 20 MG Tbec Take 20 mg by mouth daily.   ondansetron 4 MG tablet Commonly known as: ZOFRAN Take 1 tablet (4 mg total) by mouth every 4 (four) hours as needed for nausea.   polyethylene glycol 17 g packet Commonly known as: MIRALAX / GLYCOLAX Take 17 g by mouth daily. Start taking on: January 24, 2023        Discharge Instructions/  Education: Discharge instructions given to patient/family with verbalized understanding. Discharge education completed with patient/family including: follow up instructions, medication list, discharge activities, and limitations if indicated. Additional discharge instructions as indicated by discharging provider also reviewed. Patient and family able to verbalize understanding, all questions fully answered. Patient instructed to return to Emergency Department, call 911, or call MD for any changes in condition.  Patient escorted via wheelchair to lobby and discharged home via private automobile.

## 2023-01-26 LAB — CULTURE, BLOOD (ROUTINE X 2)
Culture: NO GROWTH
Culture: NO GROWTH
Special Requests: ADEQUATE
Special Requests: ADEQUATE

## 2023-02-03 ENCOUNTER — Ambulatory Visit: Payer: Medicare Other | Admitting: Gastroenterology

## 2023-02-03 ENCOUNTER — Encounter: Payer: Self-pay | Admitting: Gastroenterology

## 2023-02-03 VITALS — BP 150/95 | HR 102 | Temp 97.7°F | Wt 212.0 lb

## 2023-02-03 DIAGNOSIS — K8050 Calculus of bile duct without cholangitis or cholecystitis without obstruction: Secondary | ICD-10-CM | POA: Diagnosis not present

## 2023-02-03 NOTE — Progress Notes (Signed)
Primary Care Physician: Rusty Aus, MD  Primary Gastroenterologist:  Dr. Lucilla Lame  Chief Complaint  Patient presents with   Follow-up    HPI: Maria Manning is a 82 y.o. female here for follow-up after having an ERCP for common bile duct stones.  The patient states that she is not back to her normal self but reports that she is getting stronger.  She is no longer having any abdominal pain.  She denies any nausea vomiting fevers or chills.  Past Medical History:  Diagnosis Date   Elevated lipids    GERD (gastroesophageal reflux disease)    Hypertension    PE (pulmonary embolism)     Current Outpatient Medications  Medication Sig Dispense Refill   acetaminophen (TYLENOL) 500 MG tablet Take 500-1,000 mg by mouth every 6 (six) hours as needed for mild pain, moderate pain or fever.     ALPRAZolam (XANAX) 0.25 MG tablet Take 0.25 mg by mouth at bedtime as needed for sleep.     amLODipine (NORVASC) 5 MG tablet Take 5 mg by mouth daily.     aspirin 81 MG tablet Take 81 mg by mouth daily.     Calcium Carbonate-Vitamin D3 (CALCIUM 600-D) 600-400 MG-UNIT TABS Take 1 tablet by mouth 2 (two) times daily.      HYDROcodone-acetaminophen (NORCO/VICODIN) 5-325 MG tablet Take 1 tablet by mouth every 4 (four) hours as needed for moderate pain. 20 tablet 0   losartan (COZAAR) 100 MG tablet Take 100 mg by mouth daily.     metoprolol tartrate (LOPRESSOR) 25 MG tablet Take 25 mg by mouth 2 (two) times daily.     Multiple Vitamin (MULTIVITAMIN) tablet Take 1 tablet by mouth daily.     Omeprazole 20 MG TBEC Take 20 mg by mouth daily.      ondansetron (ZOFRAN) 4 MG tablet Take 1 tablet (4 mg total) by mouth every 4 (four) hours as needed for nausea. 20 tablet 0   polyethylene glycol (MIRALAX / GLYCOLAX) 17 g packet Take 17 g by mouth daily. 14 each 0   No current facility-administered medications for this visit.    Allergies as of 02/03/2023 - Review Complete 02/03/2023  Allergen  Reaction Noted   Ace inhibitors Cough 07/27/2014   Codeine Nausea Only and Nausea And Vomiting 07/27/2014   Elemental sulfur Other (See Comments) 05/30/2016   Lansoprazole Other (See Comments) 07/27/2014   Morphine and related Nausea And Vomiting 04/21/2017   Penicillins  07/08/2016   Ranitidine  07/08/2016   Ranitidine hcl Other (See Comments) 07/27/2014   Telmisartan Other (See Comments) 07/27/2014   Penicillin v potassium Rash 07/27/2014    ROS:  General: Negative for anorexia, weight loss, fever, chills, fatigue, weakness. ENT: Negative for hoarseness, difficulty swallowing , nasal congestion. CV: Negative for chest pain, angina, palpitations, dyspnea on exertion, peripheral edema.  Respiratory: Negative for dyspnea at rest, dyspnea on exertion, cough, sputum, wheezing.  GI: See history of present illness. GU:  Negative for dysuria, hematuria, urinary incontinence, urinary frequency, nocturnal urination.  Endo: Negative for unusual weight change.    Physical Examination:   BP (!) 150/95 (BP Location: Right Arm, Patient Position: Sitting, Cuff Size: Large)   Pulse (!) 102   Temp 97.7 F (36.5 C) (Oral)   Wt 212 lb (96.2 kg)   BMI 38.78 kg/m   General: Well-nourished, well-developed in no acute distress.  Eyes: No icterus. Conjunctivae pink. Neuro: Alert and oriented x 3.  Grossly intact. Skin:  Warm and dry, no jaundice.   Psych: Alert and cooperative, normal mood and affect.  Labs:    Imaging Studies: DG C-Arm 1-60 Min-No Report  Result Date: 01/20/2023 Fluoroscopy was utilized by the requesting physician.  No radiographic interpretation.   MR ABDOMEN MRCP W WO CONTAST  Result Date: 01/19/2023 CLINICAL DATA:  Right upper quadrant abdominal pain EXAM: MRI ABDOMEN WITHOUT AND WITH CONTRAST (INCLUDING MRCP) TECHNIQUE: Multiplanar multisequence MR imaging of the abdomen was performed both before and after the administration of intravenous contrast. Heavily T2-weighted  images of the biliary and pancreatic ducts were obtained, and three-dimensional MRCP images were rendered by post processing. CONTRAST:  15m GADAVIST GADOBUTROL 1 MMOL/ML IV SOLN COMPARISON:  CT 01/19/2023 FINDINGS: Lower chest: No acute findings.  Small hiatal hernia. Hepatobiliary: No focal intrahepatic mass identified. Normal hepatic parenchymal signal intensity. Moderate intra and extrahepatic biliary ductal dilation with the extrahepatic bile duct measuring up to 16 mm in diameter. There is a intraluminal filling defect compatible with a calculus measuring up to 14 mm in diameter within the distal common duct at the ampulla. Status post cholecystectomy. Pancreas: No enhancing intrapancreatic mass identified. No pancreatic ductal dilation. No peripancreatic inflammatory changes seen. Several simple cysts are seen scattered within the head and uncinate process of the pancreas as well as the distal body of the pancreas which demonstrate no significant restricted diffusion or enhancement and are most in keeping with multiple small dilated pancreatic side branches. A single 8 mm cyst is identified, however, within the neck of the pancreas at image # 19/9 and demonstrates restricted diffusion, best appreciated on image # 19/10. 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 No associated abnormal enhancement. Spleen:  Within normal limits in size and appearance. Adrenals/Urinary Tract: The adrenal glands are unremarkable. Simple cortical cysts are seen within the kidneys bilaterally. No follow-up imaging is recommended. Mild bilateral nonspecific perinephric stranding. The kidneys are otherwise unremarkable. No hydronephrosis. Stomach/Bowel: Visualized portions within the abdomen are unremarkable. Vascular/Lymphatic: No pathologically enlarged lymph nodes identified. No abdominal aortic aneurysm demonstrated. Other:  None. Musculoskeletal:  No suspicious bone lesions identified. IMPRESSION: 1. Moderate intra and extrahepatic biliary ductal dilation secondary to a 14 mm calculus within the distal common duct at the ampulla. 2. Several simple appearing cysts seen scattered within the head and uncinate process of the pancreas as well as the distal body of the pancreas which demonstrate no significant restricted diffusion or enhancement and are most in keeping with multiple small dilated pancreatic side branches. 3. A single 8 mm cyst within the neck of the pancreas demonstrates restricted diffusion without associated abnormal enhancement. 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 No aggressive features are identified though the examination is limited by motion artifact. This could be further assessed with EUS/FNA if clinically indicated. 4. Small hiatal hernia. Electronically Signed   By: AFidela SalisburyM.D.   On: 01/19/2023 04:28   MR 3D Recon At Scanner  Result Date: 01/19/2023 CLINICAL DATA:  Right upper quadrant abdominal pain EXAM: MRI ABDOMEN WITHOUT AND WITH CONTRAST (INCLUDING MRCP) TECHNIQUE: Multiplanar multisequence MR imaging of the abdomen was performed both before and after the administration of intravenous contrast. Heavily T2-weighted images of the biliary and pancreatic ducts were obtained, and three-dimensional MRCP images were rendered by post processing. CONTRAST:  152mGADAVIST GADOBUTROL 1 MMOL/ML IV SOLN COMPARISON:  CT 01/19/2023 FINDINGS: Lower chest:  No acute findings.  Small hiatal hernia. Hepatobiliary: No focal intrahepatic mass identified. Normal hepatic parenchymal signal intensity. Moderate intra and extrahepatic biliary ductal dilation with the extrahepatic bile duct measuring up to 16 mm in diameter. There is a intraluminal filling defect compatible with a calculus measuring up to 14 mm in diameter within the distal common duct at the  ampulla. Status post cholecystectomy. Pancreas: No enhancing intrapancreatic mass identified. No pancreatic ductal dilation. No peripancreatic inflammatory changes seen. Several simple cysts are seen scattered within the head and uncinate process of the pancreas as well as the distal body of the pancreas which demonstrate no significant restricted diffusion or enhancement and are most in keeping with multiple small dilated pancreatic side branches. A single 8 mm cyst is identified, however, within the neck of the pancreas at image # 19/9 and demonstrates restricted diffusion, best appreciated on image # 19/10. 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 No associated abnormal enhancement. Spleen:  Within normal limits in size and appearance. Adrenals/Urinary Tract: The adrenal glands are unremarkable. Simple cortical cysts are seen within the kidneys bilaterally. No follow-up imaging is recommended. Mild bilateral nonspecific perinephric stranding. The kidneys are otherwise unremarkable. No hydronephrosis. Stomach/Bowel: Visualized portions within the abdomen are unremarkable. Vascular/Lymphatic: No pathologically enlarged lymph nodes identified. No abdominal aortic aneurysm demonstrated. Other:  None. Musculoskeletal: No suspicious bone lesions identified. IMPRESSION: 1. Moderate intra and extrahepatic biliary ductal dilation secondary to a 14 mm calculus within the distal common duct at the ampulla. 2. Several simple appearing cysts seen scattered within the head and uncinate process of the pancreas as well as the distal body of the pancreas which demonstrate no significant restricted diffusion or enhancement and are most in keeping with multiple small dilated pancreatic side branches. 3. A single 8 mm cyst within the neck of the pancreas demonstrates restricted diffusion without associated abnormal enhancement. 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 No aggressive features are identified though the examination is limited by motion artifact. This could be further assessed with EUS/FNA if clinically indicated. 4. Small hiatal hernia. Electronically Signed   By: Fidela Salisbury M.D.   On: 01/19/2023 04:28   CT ABDOMEN PELVIS W CONTRAST  Result Date: 01/19/2023 CLINICAL DATA:  Right lower quadrant abdominal pain. EXAM: CT ABDOMEN AND PELVIS WITH CONTRAST TECHNIQUE: Multidetector CT imaging of the abdomen and pelvis was performed using the standard protocol following bolus administration of intravenous contrast. RADIATION DOSE REDUCTION: This exam was performed according to the departmental dose-optimization program which includes automated exposure control, adjustment of the mA and/or kV according to patient size and/or use of iterative reconstruction technique. CONTRAST:  138m OMNIPAQUE IOHEXOL 300 MG/ML  SOLN COMPARISON:  CT of the abdomen pelvis dated 10/23/2016. FINDINGS: Lower chest: Left lung base linear atelectasis/scarring. The visualized lung bases are otherwise clear. There is coronary vascular calcification. No intra-abdominal free air or free fluid. Hepatobiliary: The liver is unremarkable. Cholecystectomy. There is dilatation of the intrahepatic and extrahepatic biliary tree. The common bile duct measures approximately 19 mm in diameter. A 13 mm nodular density in the central CBD at the head of the pancreas (coronal 59/603) suspicious for a stone. Further evaluation with MRCP is recommended. Pancreas: Several small cystic pancreatic lesions measuring up to 11 mm in the uncinate process of the pancreas. 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 Attention on MRI recommended. No active inflammatory changes. Spleen: Normal in size without focal abnormality. Adrenals/Urinary Tract: The adrenal glands are  unremarkable. There is a 12 mm left renal inferior pole cyst. There is no hydronephrosis on either side. There is symmetric enhancement and excretion of contrast by both kidneys. The visualized ureters and urinary bladder appear unremarkable. Stomach/Bowel: There is a small hiatal hernia. There is no bowel obstruction or active inflammation. The appendix is not visualized with certainty. No inflammatory changes identified in the right lower quadrant. Vascular/Lymphatic: Moderate aortoiliac atherosclerotic disease. The IVC is unremarkable. No portal venous gas. There is no adenopathy. Reproductive: Hysterectomy.  No adnexal masses. Other: None Musculoskeletal: Osteopenia with degenerative changes of the spine. No acute osseous pathology. IMPRESSION: 1. No bowel obstruction. 2. Dilatation of the biliary tree likely related to obstruction of the central CBD at the head of the pancreas. Further evaluation with MRCP is recommended. 3.  Aortic Atherosclerosis (ICD10-I70.0). Electronically Signed   By: Anner Crete M.D.   On: 01/19/2023 00:54    Assessment and Plan:   DENISHIA CITRO is a 82 y.o. y/o female who is here for follow-up after having an ERCP with stone extraction.  The patient has had no further pain and is doing well.  The patient has no complaints at the present time.  The patient has been told to contact me if she has any further issues.     Lucilla Lame, MD. Marval Regal    Note: This dictation was prepared with Dragon dictation along with smaller phrase technology. Any transcriptional errors that result from this process are unintentional.

## 2024-01-01 ENCOUNTER — Other Ambulatory Visit: Payer: Self-pay | Admitting: Internal Medicine

## 2024-01-01 DIAGNOSIS — K862 Cyst of pancreas: Secondary | ICD-10-CM

## 2024-01-07 DIAGNOSIS — K862 Cyst of pancreas: Secondary | ICD-10-CM | POA: Diagnosis not present

## 2024-01-12 ENCOUNTER — Ambulatory Visit
Admission: RE | Admit: 2024-01-12 | Discharge: 2024-01-12 | Disposition: A | Discharge status: Home / Self Care | Payer: No Typology Code available for payment source | Source: Ambulatory Visit | Attending: Internal Medicine | Admitting: Internal Medicine

## 2024-01-12 DIAGNOSIS — I700 Atherosclerosis of aorta: Secondary | ICD-10-CM | POA: Diagnosis not present

## 2024-01-12 DIAGNOSIS — K449 Diaphragmatic hernia without obstruction or gangrene: Secondary | ICD-10-CM | POA: Diagnosis not present

## 2024-01-12 DIAGNOSIS — Z9049 Acquired absence of other specified parts of digestive tract: Secondary | ICD-10-CM | POA: Diagnosis not present

## 2024-01-12 DIAGNOSIS — K862 Cyst of pancreas: Secondary | ICD-10-CM | POA: Diagnosis not present

## 2024-01-12 MED ORDER — GADOBUTROL 1 MMOL/ML IV SOLN
9.0000 mL | Freq: Once | INTRAVENOUS | Status: AC | PRN
  Administered 2024-01-12: 9 mL via INTRAVENOUS

## 2024-02-02 DIAGNOSIS — I10 Essential (primary) hypertension: Secondary | ICD-10-CM | POA: Diagnosis not present

## 2024-02-02 DIAGNOSIS — Z6835 Body mass index (BMI) 35.0-35.9, adult: Secondary | ICD-10-CM | POA: Diagnosis not present
